# Patient Record
Sex: Male | Born: 1940 | Race: White | Hispanic: No | Marital: Married | Smoking: Former smoker
Health system: Southern US, Community
[De-identification: ages and names within clinical notes are randomized; demographics above are authoritative.]

## PROBLEM LIST (undated history)

## (undated) DIAGNOSIS — R011 Cardiac murmur, unspecified: Secondary | ICD-10-CM

## (undated) DIAGNOSIS — A770 Spotted fever due to Rickettsia rickettsii: Secondary | ICD-10-CM

## (undated) DIAGNOSIS — G4730 Sleep apnea, unspecified: Secondary | ICD-10-CM

## (undated) DIAGNOSIS — I4891 Unspecified atrial fibrillation: Secondary | ICD-10-CM

## (undated) DIAGNOSIS — I10 Essential (primary) hypertension: Secondary | ICD-10-CM

## (undated) DIAGNOSIS — I714 Abdominal aortic aneurysm, without rupture, unspecified: Secondary | ICD-10-CM

## (undated) DIAGNOSIS — I340 Nonrheumatic mitral (valve) insufficiency: Secondary | ICD-10-CM

## (undated) DIAGNOSIS — E785 Hyperlipidemia, unspecified: Secondary | ICD-10-CM

## (undated) DIAGNOSIS — Z8619 Personal history of other infectious and parasitic diseases: Secondary | ICD-10-CM

## (undated) DIAGNOSIS — R413 Other amnesia: Secondary | ICD-10-CM

## (undated) DIAGNOSIS — I2510 Atherosclerotic heart disease of native coronary artery without angina pectoris: Secondary | ICD-10-CM

## (undated) DIAGNOSIS — K5900 Constipation, unspecified: Secondary | ICD-10-CM

## (undated) HISTORY — PX: PENILE PROSTHESIS IMPLANT: SHX240

## (undated) HISTORY — DX: Spotted fever due to Rickettsia rickettsii: A77.0

## (undated) HISTORY — DX: Other amnesia: R41.3

## (undated) HISTORY — PX: OTHER SURGICAL HISTORY: SHX169

## (undated) HISTORY — DX: Personal history of other infectious and parasitic diseases: Z86.19

## (undated) HISTORY — DX: Constipation, unspecified: K59.00

---

## 2004-07-04 ENCOUNTER — Observation Stay (HOSPITAL_COMMUNITY): Admission: RE | Admit: 2004-07-04 | Discharge: 2004-07-05 | Payer: Self-pay | Admitting: Urology

## 2008-05-24 ENCOUNTER — Ambulatory Visit: Payer: Self-pay | Admitting: Internal Medicine

## 2008-05-24 DIAGNOSIS — E785 Hyperlipidemia, unspecified: Secondary | ICD-10-CM | POA: Insufficient documentation

## 2008-05-24 DIAGNOSIS — I10 Essential (primary) hypertension: Secondary | ICD-10-CM | POA: Insufficient documentation

## 2009-10-29 ENCOUNTER — Encounter: Payer: Self-pay | Admitting: Internal Medicine

## 2010-01-14 ENCOUNTER — Encounter: Payer: Self-pay | Admitting: Internal Medicine

## 2010-01-30 ENCOUNTER — Ambulatory Visit: Payer: Self-pay | Admitting: Internal Medicine

## 2010-03-30 ENCOUNTER — Encounter: Payer: Self-pay | Admitting: Internal Medicine

## 2010-03-31 ENCOUNTER — Encounter: Payer: Self-pay | Admitting: Internal Medicine

## 2010-04-23 ENCOUNTER — Ambulatory Visit: Payer: Self-pay | Admitting: Internal Medicine

## 2010-04-23 DIAGNOSIS — R930 Abnormal findings on diagnostic imaging of skull and head, not elsewhere classified: Secondary | ICD-10-CM

## 2010-07-23 NOTE — Miscellaneous (Signed)
Summary: Orders Update pft charges  Clinical Lists Changes  Orders: Added new Service order of Carbon Monoxide diffusing w/capacity (94720) - Signed Added new Service order of Lung Volumes (94240) - Signed Added new Service order of Spirometry (Pre & Post) (94060) - Signed 

## 2010-07-23 NOTE — Letter (Signed)
Summary: [facility]   Imported By: Sherian Rein 04/26/2010 14:59:23  _____________________________________________________________________  External Attachment:    Type:   Image     Comment:   External Document

## 2010-07-23 NOTE — Assessment & Plan Note (Signed)
Summary: Pulmonary/ copd re-eval   Visit Type:  Initial Consult Copy to:  Dr. Sherral Hammers Primary Kieli Golladay/Referring Lucienne Sawyers:  Dr. Laddie Aquas  CC:  COPD followup- recently had bronchitis x 2 wks ago.  He states that Dr Sherral Hammers at [facility] wanted him to followup here.  He states currently not having any problems with breathing. Marland Kitchen  History of Present Illness: 55 yowm quit smoking 6/09 at wt 197 with no resp problems except for tendency to bronchitis/wheezing worse in winter, better since quit smoking.  May 24, 2008 initial pulm eval stating he can still jog when he wants to at a slow pace. No noct symptoms or early am cough or congestion.    January 30, 2010 cc COPD followup-  had bronchitis x 2 wks ago.  He states that Dr Sherral Hammers at [facility] wanted him to followup here.  He states currently not having any problems with breathing at all at present.  no limiting activities, no need for atrovent neb or noct c/os'. Pt denies any significant sore throat, dysphagia, itching, sneezing,  nasal congestion or excess secretions,  fever, chills, sweats, unintended wt loss, pleuritic or exertional cp, hempoptysis, change in activity tolerance  orthopnea pnd or leg swelling. Pt also denies any obvious fluctuation in symptoms with weather or environmental change or other alleviating or aggravating factors.       Current Medications (verified): 1)  Simvastatin 40 Mg Tabs (Simvastatin) .Marland Kitchen.. 1 Every Evening 2)  Carisoprodol 350 Mg Tabs (Carisoprodol) .Marland Kitchen.. 1 Four Times A Day 3)  Hydrocodone-Acetaminophen 5-500 Mg Tabs (Hydrocodone-Acetaminophen) .Marland Kitchen.. 1 Every 4 Hours As Needed 4)  Pradaxa 150 Mg Caps (Dabigatran Etexilate Mesylate) .Marland Kitchen.. 1 Two Times A Day 5)  Lanoxin 0.125 Mg Tabs (Digoxin) .Marland Kitchen.. 1 Once Daily and 1/2 3 Times Per Wk 6)  Carvedilol 12.5 Mg Tabs (Carvedilol) .Marland Kitchen.. 1 Two Times A Day 7)  Mirapex 0.125 Mg Tabs (Pramipexole Dihydrochloride) .Marland Kitchen.. 1 At Bedtime 8)  Oxybutynin Chloride 10 Mg Xr24h-Tab (Oxybutynin Chloride)  .Marland Kitchen.. 1 At Bedtime 9)  Ipratropium Bromide 0.02 % Soln (Ipratropium Bromide) .Marland Kitchen.. 1 Eveyr 6 Hours As Needed  Allergies (verified): No Known Drug Allergies  Past History:  Past Medical History: Hypertension Hyperlipidemia Rheumatic fever as child CAF....................................................Marland KitchenRevencar Alvo/ HP Rorvack     - July 2011 dx'd with ok LV, leaking mitral valve COPD, no pft's on file       Vital Signs:  Patient profile:   70 year old male Height:      70 inches Weight:      191.13 pounds BMI:     27.52 O2 Sat:      99 % on Room air Temp:     98.3 degrees F oral Pulse rate:   90 / minute BP sitting:   116 / 74  (left arm)  Vitals Entered By: Vernie Murders (January 30, 2010 10:33 AM)  O2 Flow:  Room air  Physical Exam  Additional Exam:  wt 197  May 24, 2008  > 191 January 30, 2010  HEENT mild turbinate edema.  Oropharynx no thrush or excess pnd or cobblestoning.  No JVD or cervical adenopathy. Mild accessory muscle hypertrophy. Trachea midline, nl thryroid. Chest was hyperinflated by percussion with diminished breath sounds and moderate increased exp time without wheeze. Hoover sign positive at mid inspiration. Regular rate and rhythm without murmur gallop or rub or increase P2. No edema Abd: no hsm, nl excursion. Ext warm without cyanosis, clubbing.     Impression & Recommendations:  Problem # 1:  COPD UNSPECIFIED (ICD-496) Relatively mild clinically and not limiting him or exacerbating frequently enough to warrant any form of maint rx.   Each maintenance medication was reviewed in detail including most importantly the difference between maintenance and as needed and under what circumstances the prns are to be used.   Needs baseline PFT's .  Contingencies discussed re how to treat exacerbations  Problem # 2:  HYPERTENSION (ICD-401.9)  The following medications were removed from the medication list:    Diovan 80 Mg Tabs (Valsartan) .Marland Kitchen... 1  once daily His updated medication list for this problem includes:    Carvedilol 12.5 Mg Tabs (Carvedilol) .Marland Kitchen... 1 two times a day  Coreg use problematic in pts with unstable airways dz but probably ok here for now  Orders: Est. Patient Level IV (16109)  Medications Added to Medication List This Visit: 1)  Simvastatin 40 Mg Tabs (Simvastatin) .Marland Kitchen.. 1 every evening 2)  Pradaxa 150 Mg Caps (Dabigatran etexilate mesylate) .Marland Kitchen.. 1 two times a day 3)  Lanoxin 0.125 Mg Tabs (Digoxin) .Marland Kitchen.. 1 once daily and 1/2 3 times per wk 4)  Carvedilol 12.5 Mg Tabs (Carvedilol) .Marland Kitchen.. 1 two times a day 5)  Mirapex 0.125 Mg Tabs (Pramipexole dihydrochloride) .Marland Kitchen.. 1 at bedtime 6)  Oxybutynin Chloride 10 Mg Xr24h-tab (Oxybutynin chloride) .Marland Kitchen.. 1 at bedtime 7)  Ipratropium Bromide 0.02 % Soln (Ipratropium bromide) .Marland Kitchen.. 1 eveyr 6 hours as needed  Patient Instructions: 1)  Return to office in 3 months, sooner if needed with PFT's on return

## 2010-07-23 NOTE — Assessment & Plan Note (Signed)
Summary: Pulmonary/ f/u ov with G II COPD and no walking desat   Copy to:  Dr. Sherral Hammers Primary Provider/Referring Provider:  Dr. Laddie Aquas  CC:  Cough- resolved.  History of Present Illness: 24  yowm quit smoking 11/2007  at wt 197 with no resp problems except for tendency to bronchitis/wheezing worse in winter, better since quit smoking.  May 24, 2008 initial pulm eval stating he can still jog when he wants to at a slow pace. No noct symptoms or early am cough or congestion.    January 30, 2010 cc COPD followup-  had bronchitis x 2 wks ago.  He states that Dr Sherral Hammers at [facility] wanted him to followup here.  He states currently not having any problems with breathing at all at present.  no limiting activities, no need for atrovent neb or noct c/os'. rec no change in recs,  use neb if needed.  April 23, 2010 ov  cc sob in September dx'd with afib rx blood thinner and shocked > complete resolution of symptoms then Oct 8th woke up feeling like tired  then short of breath > Admitted to [facility] dx with ? acute bronchitis (but denies cough) with NT BNP 1500   at disharge now doe with ex not at rest,  "flu like symptoms" resolved but never had fever.  Main cc now is feeling faint if goes up steps too fast or gets in a hurry.  Also chronic paresthesias bilateral legs worse with walking sometimes, sometimes not . Pt denies any significant sore throat, dysphagia, itching, sneezing,  nasal congestion or excess secretions,  fever, chills, sweats, unintended wt loss, pleuritic or exertional cp, hempoptysis, change in activity tolerance  orthopnea pnd or leg swelling Pt also denies any obvious fluctuation in symptoms with weather or environmental change or other alleviating or aggravating factors.        Current Medications (verified): 1)  Pradaxa 150 Mg Caps (Dabigatran Etexilate Mesylate) .Marland Kitchen.. 1 Two Times A Day 2)  Carvedilol 12.5 Mg Tabs (Carvedilol) .Marland Kitchen.. 1 Two Times A Day 3)  Mirapex 0.125 Mg Tabs  (Pramipexole Dihydrochloride) .Marland Kitchen.. 1 At Bedtime 4)  Oxybutynin Chloride 10 Mg Xr24h-Tab (Oxybutynin Chloride) .Marland Kitchen.. 1 At Bedtime 5)  Amiodarone Hcl 200 Mg Tabs (Amiodarone Hcl) .Marland Kitchen.. 1 Once Daily 6)  Centrum Silver  Tabs (Multiple Vitamins-Minerals) .Marland Kitchen.. 1 Once Daily 7)  Calcium Plus Vitamin D 500-50 Mg-Unit Caps (Calcium Carbonate-Vitamin D) .Marland Kitchen.. 1 Two Times A Day 8)  Magnesium 250 Mg Tabs (Magnesium) .Marland Kitchen.. 1 Two Times A Day 9)  Ipratropium Bromide 0.02 % Soln (Ipratropium Bromide) .Marland Kitchen.. 1 Eveyr 6 Hours As Needed 10)  Carisoprodol 350 Mg Tabs (Carisoprodol) .Marland Kitchen.. 1 Four Times A Day As Needed 11)  Hydrocodone-Acetaminophen 5-500 Mg Tabs (Hydrocodone-Acetaminophen) .Marland Kitchen.. 1 Every 6 Hrs As Needed  Allergies (verified): No Known Drug Allergies  Past History:  Past Medical History: Hypertension Hyperlipidemia Rheumatic fever as child Spinal stenosis  CAF....................................................Marland KitchenRevencar Edgerton/ HP Rorvack     - July 2011 dx'd with ok LV, leaking mitral valve COPD      - PFT's April 23, 2010 FEV 1 2.05 (69%) ratio 61  and no better p B2,  DLCO 89% ? Pulmonary Fibrososi       - CT chest 10/29/09 and 03/30/10 minimal PF, no acute changes       Vital Signs:  Patient profile:   70 year old male Weight:      188 pounds O2 Sat:  95 % on Room air Temp:     97.4 degrees F oral Pulse rate:   62 / minute BP sitting:   124 / 68  (left arm)  Vitals Entered By: Vernie Murders (April 23, 2010 9:44 AM)  O2 Flow:  Room air  Serial Vital Signs/Assessments:  Comments: 10:43 AM Ambulatory Pulse Oximetry  Resting; HR__65___    02 Sat___97%ra__  Lap1 (185 feet)   HR_78____   02 Sat__96%ra___ Lap2 (185 feet)   HR_78___   02 Sat__96%ra___    Lap3 (185 feet)   HR__85___   02 Sat__95%ra___  _x__Test Completed without Difficulty ___Test Stopped due to:   By: Vernie Murders    Physical Exam  Additional Exam:  wt 197  May 24, 2008  > 191 January 30, 2010  > 188 April 24, 2010  HEENT mild turbinate edema.  Oropharynx no thrush or excess pnd or cobblestoning.  No JVD or cervical adenopathy. Mild accessory muscle hypertrophy. Trachea midline, nl thryroid. Chest was hyperinflated by percussion with diminished breath sounds and moderate increased exp time without wheeze. Hoover sign positive at mid inspiration. Regular rate and rhythm with II/VI hsm but no  gallop or rub or increase P2. No edema Abd: no hsm, nl excursion. Ext warm without cyanosis, clubbing.     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  Once the high risk activity stops,  the symptoms should not suddenly erupt.  If so, the differential diagnosis should include  obesity/deconditioning,  LPR/Reflux, CHF, or side effect of medications (amiodarone with PF and coreg with asthma)  Presently not limited by breathing.  I reviewed the Flethcher curve with her that basically says if you quit smoking when your best day FEV1 is still well preserved it is highly unlikely you will progress to severe disease and informed the patient there was no medication on the market that has proven to change the curve or the likelihood of progression.  Treatment is symptomatic and presenlty not needed.  He may be prone to exacerbations but without cough I very seriously doubt his acute problem on Oct 8 resulting in admit was copd related.  Problem # 2:  ABNORMAL LUNG XRAY (ICD-793.1) Ct from 10/29/09 and 03/30/10 reviewed - he does have very  mild PF but this is not evolving, is not assoc with ex desat or low DLCO - it is a concern in  a pt on amiodarone and if any progression at all would first stop the amiodarone in favor of multaq,  then return here if progresses.  would also follow serial ESR's here.  Medications Added to Medication List This Visit: 1)  Amiodarone Hcl 200 Mg Tabs (Amiodarone hcl) .Marland Kitchen.. 1 once daily 2)  Centrum Silver Tabs (Multiple vitamins-minerals) .Marland Kitchen.. 1 once daily 3)  Calcium Plus Vitamin D 500-50 Mg-unit Caps (Calcium carbonate-vitamin d) .Marland Kitchen.. 1 two times a day 4)  Magnesium 250 Mg Tabs (Magnesium) .Marland Kitchen.. 1 two times a day 5)  Carisoprodol 350 Mg Tabs (Carisoprodol) .Marland Kitchen.. 1 four times a day as needed 6)  Hydrocodone-acetaminophen 5-500 Mg Tabs (Hydrocodone-acetaminophen) .Marland Kitchen.. 1 every 6 hrs as needed  Other Orders: Est. Patient Level IV (99214) Pulse Oximetry, Ambulatory (16109) Misc. Referral (Misc. Ref)  Patient Instructions: 1)  Your breathing is actually quite good by your lung function tests 2)  it's ok to use the nebulizer as needed for now up to every 6 hours 3)  Your coreg and amiodarone could be causing breathing problems and may be need to be changed to alternatives (bisoprolol and multaq)  4)  GERD (REFLUX)  is a common cause of respiratory symptoms. It commonly presents without heartburn and can be treated with medication, but also with lifestyle changes including avoidance of late meals, excessive alcohol, smoking cessation, and avoid fatty foods, chocolate, peppermint, colas, red wine, and acidic juices such as orange juice. NO MINT OR MENTHOL PRODUCTS SO NO COUGH DROPS  5)  USE SUGARLESS CANDY INSTEAD (jolley ranchers)  6)  NO OIL BASED VITAMINS

## 2010-08-14 ENCOUNTER — Other Ambulatory Visit (INDEPENDENT_AMBULATORY_CARE_PROVIDER_SITE_OTHER): Payer: Self-pay

## 2010-08-14 DIAGNOSIS — R0989 Other specified symptoms and signs involving the circulatory and respiratory systems: Secondary | ICD-10-CM

## 2010-11-08 NOTE — Op Note (Signed)
NAME:  Timothy Wheeler, Timothy Wheeler              ACCOUNT NO.:  [id number]   MEDICAL RECORD NO.:  [id number]          PATIENT TYPE:  OBV   LOCATION:  0370                         FACILITY:  [facility]   PHYSICIAN:  Sigmund I. Patsi Sears, M.D.DATE OF BIRTH:  [birth year]   DATE OF PROCEDURE:  07/04/2004  DATE OF DISCHARGE:  07/05/2004                                 OPERATIVE REPORT   PREOPERATIVE DIAGNOSIS:  Organic impotence.   POSTOPERATIVE DIAGNOSIS:  Organic impotence.   PROCEDURE PERFORMED:  Insertion of a three-piece inflatable penile  prosthesis (AMS).   ATTENDING SURGEON:  Sigmund I. Patsi Sears, M.D.   RESIDENT SURGEON:  Thyra Breed, M.D.   ANESTHESIA:  General endotracheal.   ESTIMATED BLOOD LOSS:  50 mL.   DRAINS:  A 16 French Foley catheter to straight drainage.   COMPLICATIONS:  None.   INDICATION FOR PROCEDURE:  Timothy Wheeler is a pleasant 70 year old male with a  history of erectile dysfunction.  Specifically, he has had difficulty  maintaining erections since 1997.  The patient has been treated with  multiple research medications for a history of herpetic neuralgia.  He has  also had NMP monitoring in the past, which showed a partial erection and  sensory loss.  He has had a good result for some years with Caverject  injections.  However, this is no longer helping the patient to have an  erection sufficient for intercourse.  He has also failed oral medication  therapy.  Therefore, at this time he wishes to undergo surgical intervention  in the form of implantation of a three-piece penile prosthesis.  All risks,  benefits, and alternatives of the procedure have been described in detail,  and the patient is willing to proceed.   PROCEDURE IN DETAIL:  Following identification by his arm bracelet, the  patient was brought to the operating room and placed in the supine position.  Here he underwent successful induction of general endotracheal anesthesia  and received preoperative IV  antibiotics.  He then underwent a 10-minute  iodine scrub.  His genitalia were then prepped with Betadine and draped in  the usual sterile fashion.  We initially created an approximate 4 cm  incision along the lines of Langer just at the dorsal base of the penis in a  U-shaped fashion.  The incision was carried down through the subcutaneous  tissue using Bovie electrocautery.  We initially dissected the subcutaneous  tissue through Scarpa's fascia superiorly above the pubic symphysis until  the rectus fascia was encountered.  A small 2 cm incision was made in the  rectus fascia in the midline.  Surgeon's finger was used bluntly to create a  space below the right rectus muscle for later placement of the reservoir.  We then placed a Weitlaner retractor and continued our dissection inferiorly  until the corpora were encountered.  Using Metzenbaum scissors, we then  cleared the overlying tissue from the corpora.  PDS 2-0 was then used to  provide two traction sutures on each corpora.  Corporotomies were then made  using a 15 blade scalpel.  Metzenbaum scissors were used to further create  the space both proximally and distally for dilation of the corpora.  The  corpora were then successively dilated using the [facility] dilators, starting  with a 9 up to a 14 [place].  A 13 French Hegar dilator was then placed  proximally and distally in both corpora and found to pass without  difficulty.  In fact, the dilation was not very difficult.  We then used the  marking tool to delineate approximately 19 cm bilaterally.  Therefore, we  elected to place an 18 cm device with 1 cm rear tips bilaterally.  The wound  was copiously irrigated with antibiotic solution and sterile green towels  were placed on the operative field.  The device was then brought onto the  field.  Using the [place] needle and [facility] tool, we placed the traction  sutures on the distal cylinders lateral through the glans bilaterally, well   away from the urethral meatus.  With 1 cm rear tips, these were then placed  in the proximal corporotomies.  The fit appeared to be excellent.  The  device was cycled with some gentle traction on the corporotomy sites to  reveal a straight penis with the distal portion of the cylinders palpable  just beneath the glans in their normal locations.  PDS 2-0 was then used in  a running fashion to close the corporotomies.  The traction sutures were  also tied over the corporotomy incisions for extra support.  Any excess  covering on the cylinder tubing was now removed.  A space was then created  bluntly into the most dependent portion of the right hemiscrotum.  Specifically, the right hemiscrotum was everted through the incision and a  dry Ray-Tec used to separate the surrounding tissue to allow placement of  the pump.  The pump was then seated into this pocket, which was well in the  dependent portion of the right hemiscrotum away from the testicle.  A  Babcock clamp was then placed across the tubing through the scrotal skin to  maintain the pump's position.  We then placed our reservoir in the  previously-mentioned space just beneath the right rectus muscle.  Approximately 70 mL were used to fill the reservoir, and there appeared to  be no back pressure.  Therefore, we removed 10 mL and left 60 mL in the  reservoir.  We then made our connections from the pump to the reservoir  using a straight connector.  Again the device was cycled.  It was molded  slightly to reveal an excellent result adequate for penetration.  The device  was then deflated.  A small amount of fluid was left in the cylinders to  tamponade any bleeding in the corpora.  We then closed the rectus fascia  around the reservoir tubing using a running 2-0 PDS.  The dartos was then  closed using a running 2-0 Vicryl suture, keeping all tubing and components well below the skin.  The skin was then closed in a subcuticular fashion   using a 5-0 Monocryl.  Dermabond was used to complete the closure.  Of note,  the patient had a 38 French Foley catheter inserted before the case, and  this was left to straight drainage.  A sterile dressing was then applied to  the incision.  The patient tolerated the procedure well, and there were no  complications.  Please note that Dr. Jethro Bolus was present and  participated in the entire procedure as he was the responsible surgeon.   DISPOSITION:  After awaking from general anesthesia, the patient was  transferred to the postanesthesia care unit in stable condition.  From here  he would be transferred to the floor for overnight observation and removal  of his catheter the following morning.      EG/MEDQ  D:  07/07/2004  T:  07/07/2004  Job:  [id number]

## 2010-11-27 ENCOUNTER — Encounter (INDEPENDENT_AMBULATORY_CARE_PROVIDER_SITE_OTHER): Payer: Self-pay | Admitting: *Deleted

## 2010-11-27 ENCOUNTER — Encounter: Payer: Self-pay | Admitting: Cardiology

## 2014-11-23 ENCOUNTER — Other Ambulatory Visit: Payer: Self-pay | Admitting: Urology

## 2014-12-14 NOTE — Patient Instructions (Addendum)
Kaleo NIKOLOZ KUROWSKI  12/14/2014   Your procedure is scheduled on:     12/21/2014    Report to [facility] Main  Entrance take [place]  elevators to 3rd floor to  Short Stay Center at      0530 AM.  Call this number if you have problems the morning of surgery [phone]   Remember: ONLY 1 PERSON MAY GO WITH YOU TO SHORT STAY TO GET  READY MORNING OF YOUR SURGERY.  Do not eat food or drink liquids :After Midnight.     Take these medicines the morning of surgery with A SIP OF WATER:   Flecainide ( Tambocor), Topamax                                 You may not have any metal on your body including hair pins and              piercings  Do not wear jewelry, lotions, powders or perfumes, deodorant                        Men may shave face and neck.   Do not bring valuables to the hospital. [facility] IS NOT             RESPONSIBLE   FOR VALUABLES.  Contacts, dentures or bridgework may not be worn into surgery.       Patients discharged the day of surgery will not be allowed to drive home.  Name and phone number of your driver:  Special Instructions: coughing and deep breathing exercises, leg exercises               Please read over the following fact sheets you were given: _____________________________________________________________________             [facility] - Preparing for Surgery Before surgery, you can play an important role.  Because skin is not sterile, your skin needs to be as free of germs as possible.  You can reduce the number of germs on your skin by washing with CHG (chlorahexidine gluconate) soap before surgery.  CHG is an antiseptic cleaner which kills germs and bonds with the skin to continue killing germs even after washing. Please DO NOT use if you have an allergy to CHG or antibacterial soaps.  If your skin becomes reddened/irritated stop using the CHG and inform your nurse when you arrive at Short Stay. Do not shave (including legs and  underarms) for at least 48 hours prior to the first CHG shower.  You may shave your face/neck. Please follow these instructions carefully:  1.  Shower with CHG Soap the night before surgery and the  morning of Surgery.  2.  If you choose to wash your hair, wash your hair first as usual with your  normal  shampoo.  3.  After you shampoo, rinse your hair and body thoroughly to remove the  shampoo.                           4.  Use CHG as you would any other liquid soap.  You can apply chg directly  to the skin and wash  Gently with a scrungie or clean washcloth.  5.  Apply the CHG Soap to your body ONLY FROM THE NECK DOWN.   Do not use on face/ open                           Wound or open sores. Avoid contact with eyes, ears mouth and genitals (private parts).                       Wash face,  Genitals (private parts) with your normal soap.             6.  Wash thoroughly, paying special attention to the area where your surgery  will be performed.  7.  Thoroughly rinse your body with warm water from the neck down.  8.  DO NOT shower/wash with your normal soap after using and rinsing off  the CHG Soap.                9.  Pat yourself dry with a clean towel.            10.  Wear clean pajamas.            11.  Place clean sheets on your bed the night of your first shower and do not  sleep with pets. Day of Surgery : Do not apply any lotions/deodorants the morning of surgery.  Please wear clean clothes to the hospital/surgery center.  FAILURE TO FOLLOW THESE INSTRUCTIONS MAY RESULT IN THE CANCELLATION OF YOUR SURGERY PATIENT SIGNATURE_________________________________  NURSE SIGNATURE__________________________________  ________________________________________________________________________

## 2014-12-18 ENCOUNTER — Encounter (HOSPITAL_COMMUNITY)
Admission: RE | Admit: 2014-12-18 | Discharge: 2014-12-18 | Disposition: A | Discharge status: Home / Self Care | Payer: Medicare Other | Source: Ambulatory Visit | Attending: Urology | Admitting: Urology

## 2014-12-18 ENCOUNTER — Encounter (HOSPITAL_COMMUNITY): Payer: Self-pay

## 2014-12-18 DIAGNOSIS — R350 Frequency of micturition: Secondary | ICD-10-CM | POA: Diagnosis not present

## 2014-12-18 DIAGNOSIS — Z7902 Long term (current) use of antithrombotics/antiplatelets: Secondary | ICD-10-CM | POA: Diagnosis not present

## 2014-12-18 DIAGNOSIS — Z79899 Other long term (current) drug therapy: Secondary | ICD-10-CM | POA: Diagnosis not present

## 2014-12-18 DIAGNOSIS — E780 Pure hypercholesterolemia: Secondary | ICD-10-CM | POA: Diagnosis not present

## 2014-12-18 DIAGNOSIS — F17200 Nicotine dependence, unspecified, uncomplicated: Secondary | ICD-10-CM | POA: Diagnosis not present

## 2014-12-18 DIAGNOSIS — R3912 Poor urinary stream: Secondary | ICD-10-CM | POA: Diagnosis not present

## 2014-12-18 DIAGNOSIS — N521 Erectile dysfunction due to diseases classified elsewhere: Secondary | ICD-10-CM | POA: Diagnosis not present

## 2014-12-18 DIAGNOSIS — I739 Peripheral vascular disease, unspecified: Secondary | ICD-10-CM | POA: Diagnosis not present

## 2014-12-18 DIAGNOSIS — N401 Enlarged prostate with lower urinary tract symptoms: Secondary | ICD-10-CM | POA: Diagnosis present

## 2014-12-18 DIAGNOSIS — I2510 Atherosclerotic heart disease of native coronary artery without angina pectoris: Secondary | ICD-10-CM | POA: Diagnosis not present

## 2014-12-18 DIAGNOSIS — I10 Essential (primary) hypertension: Secondary | ICD-10-CM | POA: Diagnosis not present

## 2014-12-18 DIAGNOSIS — R351 Nocturia: Secondary | ICD-10-CM | POA: Diagnosis not present

## 2014-12-18 DIAGNOSIS — G4730 Sleep apnea, unspecified: Secondary | ICD-10-CM | POA: Diagnosis not present

## 2014-12-18 DIAGNOSIS — N138 Other obstructive and reflux uropathy: Secondary | ICD-10-CM | POA: Diagnosis not present

## 2014-12-18 DIAGNOSIS — R3914 Feeling of incomplete bladder emptying: Secondary | ICD-10-CM | POA: Diagnosis not present

## 2014-12-18 DIAGNOSIS — I771 Stricture of artery: Secondary | ICD-10-CM | POA: Diagnosis not present

## 2014-12-18 DIAGNOSIS — I4891 Unspecified atrial fibrillation: Secondary | ICD-10-CM | POA: Diagnosis not present

## 2014-12-18 DIAGNOSIS — R972 Elevated prostate specific antigen [PSA]: Secondary | ICD-10-CM | POA: Diagnosis not present

## 2014-12-18 HISTORY — DX: Nonrheumatic mitral (valve) insufficiency: I34.0

## 2014-12-18 HISTORY — DX: Atherosclerotic heart disease of native coronary artery without angina pectoris: I25.10

## 2014-12-18 HISTORY — DX: Sleep apnea, unspecified: G47.30

## 2014-12-18 HISTORY — DX: Abdominal aortic aneurysm, without rupture: I71.4

## 2014-12-18 HISTORY — DX: Unspecified atrial fibrillation: I48.91

## 2014-12-18 HISTORY — DX: Abdominal aortic aneurysm, without rupture, unspecified: I71.40

## 2014-12-18 HISTORY — DX: Hyperlipidemia, unspecified: E78.5

## 2014-12-18 HISTORY — DX: Cardiac murmur, unspecified: R01.1

## 2014-12-18 HISTORY — DX: Essential (primary) hypertension: I10

## 2014-12-18 LAB — CBC
HEMATOCRIT: 42.1 % (ref 39.0–52.0)
Hemoglobin: 13.6 g/dL (ref 13.0–17.0)
MCH: 30.8 pg (ref 26.0–34.0)
MCHC: 32.3 g/dL (ref 30.0–36.0)
MCV: 95.2 fL (ref 78.0–100.0)
Platelets: 161 10*3/uL (ref 150–400)
RBC: 4.42 MIL/uL (ref 4.22–5.81)
RDW: 13 % (ref 11.5–15.5)
WBC: 7.3 10*3/uL (ref 4.0–10.5)

## 2014-12-18 LAB — BASIC METABOLIC PANEL
ANION GAP: 8 (ref 5–15)
BUN: 23 mg/dL — ABNORMAL HIGH (ref 6–20)
CO2: 25 mmol/L (ref 22–32)
Calcium: 9.1 mg/dL (ref 8.9–10.3)
Chloride: 109 mmol/L (ref 101–111)
Creatinine, Ser: 1.27 mg/dL — ABNORMAL HIGH (ref 0.61–1.24)
GFR calc Af Amer: >60 mL/min (ref >60)
GFR, EST NON AFRICAN AMERICAN: 54 mL/min — AB (ref >60)
GLUCOSE: 94 mg/dL (ref 65–99)
POTASSIUM: 3.8 mmol/L (ref 3.5–5.1)
Sodium: 142 mmol/L (ref 135–145)

## 2014-12-18 NOTE — Progress Notes (Signed)
05/15/2014 LOV with Tollie Pizza - cardiology on chart  EKG- 2014 on chart  ECHO- 05/2014 on chart  05/11/2014 - Office visit with Lu[facility] on chart on chart

## 2014-12-18 NOTE — Progress Notes (Signed)
BMp results done 12/18/2014 faxed via [facility] to Dr Patsi Sears.

## 2014-12-19 NOTE — Progress Notes (Signed)
Final EKG 12/18/2014 in EPIc.

## 2014-12-20 NOTE — Anesthesia Preprocedure Evaluation (Addendum)
Anesthesia Evaluation  Patient identified by MRN, date of birth, ID band Patient awake    Reviewed: Allergy & Precautions, NPO status , Patient's Chart, lab work & pertinent test results  History of Anesthesia Complications Negative for: history of anesthetic complications  Airway Mallampati: II  TM Distance: >3 FB Neck ROM: Full    Dental no notable dental hx. (+) Dental Advisory Given, Poor Dentition   Pulmonary sleep apnea and Continuous Positive Airway Pressure Ventilation , former smoker,  breath sounds clear to auscultation  Pulmonary exam normal       Cardiovascular hypertension, Pt. on medications + CAD and + Peripheral Vascular Disease Normal cardiovascular exam+ dysrhythmias Atrial Fibrillation + Valvular Problems/Murmurs MR Rhythm:Regular Rate:Normal  Last cardiology note reviewed in chart. Echo 05/2014: EF 55-60%, mild-mod MR, mild AR, normal LV fxn   Neuro/Psych negative neurological ROS  negative psych ROS   GI/Hepatic negative GI ROS, Neg liver ROS,   Endo/Other  negative endocrine ROS  Renal/GU negative Renal ROS  negative genitourinary   Musculoskeletal negative musculoskeletal ROS (+)   Abdominal   Peds negative pediatric ROS (+)  Hematology negative hematology ROS (+)   Anesthesia Other Findings   Reproductive/Obstetrics negative OB ROS                            Anesthesia Physical Anesthesia Plan  ASA: III  Anesthesia Plan: General   Post-op Pain Management:    Induction: Intravenous  Airway Management Planned: LMA  Additional Equipment:   Intra-op Plan:   Post-operative Plan: Extubation in OR  Informed Consent: I have reviewed the patients History and Physical, chart, labs and discussed the procedure including the risks, benefits and alternatives for the proposed anesthesia with the patient or authorized representative who has indicated his/her  understanding and acceptance.   Dental advisory given  Plan Discussed with: CRNA  Anesthesia Plan Comments:         Anesthesia Quick Evaluation

## 2014-12-20 NOTE — H&P (Signed)
Reason For Visit Cystoscopy, flowrate, PVR & PUS   Active Problems Problems  1. Benign prostatic hyperplasia with urinary obstruction (N40.1,N13.8)   Assessed By: Jethro Bolus (Urology); Last Assessed: 07 Nov 2014 2. Elevated prostate specific antigen (PSA) (R97.2) 3. 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 Nocturia (R35.1)  History of Present Illness    74 year old male returns today for a cystoscopy, flowrate, PVR & PUS for possible Urolift candidate. Hx of BPH, elevated PSA, nocturia x 3 & ED. He was recommended to have a TURP in Oct. 2013, but he postponed because of wife's job problems (laid off).      He was previously in the DDAVP study in 2012 He is status post microwave thermotherapy in 2010 for BPH. He complains of urinary frequency, weak and intermittent flow, urinary straining, and nocturia x 3. His international prostate symptom score is 15 (normal 7). Last PSA was 1.10 in Oct. 2010. The patient was placed on Avodart, but stopped the medication because of breast development, and peripheral neuropathy, controlled with Neurontin. He has a small aortic aneurysm, and a history of atrial fibrillation, and a penile prosthetic inflatable device.    Prostate ultrasound was accomplished on 03/23/12, showing a length of 4.30 cm, height of 2.0 cm, with a 4.44 cm, a volume of 29.01 cm.    Video urodynamics is accomplished on 03/23/12, in the sitting position. The first sensation of filling at 209 cc, normal desire was at 225 cc and strong desire was at 280 cc. The patient complains of pain in the bladder at maximum capacity. There is no instability noted, however. The patient did not leak for leak point pressure determination.    Pressure flow study: The patient is able to generate a voluntary bladder contraction, with a voided volume of 180 cc maximum flow rate is 4 cc per second, with detrusor pressure at maximum flow of 23 cm water pressure.  Maximum detrusor pressure is 38 cm water. PVR is 200 cc.    VCUG is accomplished, and shows a closed bladder neck. There is no bony abnormality.    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 There is loss of compliance during filling, with an end filling pressure of 12 cm water. There is no instability noted. He is able to generate a voluntary contraction. He voids with an interrupted obstructed flow pattern. He has a 200 cc PVR. He uses position changes, abdominal straining to void. He often has difficulty getting his flow started, and has an intermittent flow pattern.    He may be a candidate for traditional TURP.   Past Medical History Problems  1. History of Atrial fibrillation (I48.91) 2. History of hypercholesterolemia (Z86.39) 3. History of shortness of breath (Z87.898) 4. History of Murmur (R01.1)  Surgical History Problems  1. History of Penile Prosthetic Device Inflatable  Current Meds 1. Amitriptyline HCl - 25 MG Oral Tablet;  Therapy: (Recorded:18Mar2016) to Recorded 2. Caltrate 600+D TABS;  Therapy: (Recorded:23Jan2015) to Recorded 3. Centrum Silver TABS;  Therapy: (Recorded:23Jan2015) to Recorded 4. CoQ10 CAPS;  Therapy: (Recorded:23Jan2015) to Recorded 5. Flecainide Acetate 100 MG Oral Tablet;  Therapy: (Recorded:18Mar2016) to Recorded 6. Folic Acid 800 MCG Oral Tablet;  Therapy: (Recorded:23Jan2015) to Recorded 7. Magnesium 250 MG Oral Tablet;  Therapy: (Recorded:23Jan2015) to Recorded 8. Pradaxa 150 MG Oral Capsule;  Therapy: (Recorded:02Aug2011) to Recorded 9. ROPINIRole HCl - 0.5 MG Oral Tablet;  Therapy: (Recorded:18Mar2016) to Recorded 10. Simvastatin 40 MG Oral Tablet;  Therapy: 03Jul2012 to Recorded 11. TiZANidine HCl - 4 MG Oral Tablet;   Therapy: (Recorded:17May2016) to Recorded 12. Topiramate 25 MG Oral Tablet;   Therapy: (Recorded:18Mar2016) to Recorded 13. Valsartan-Hydrochlorothiazide 160-25 MG Oral Tablet;    Therapy: (Recorded:23Jan2015) to Recorded 14. Vitamin B12 TABS;   Therapy: (Recorded:23Jan2015) to Recorded  Allergies Medication  1. Lyrica CAPS  Social History Problems  1. Smoker, Current Status Unknown  Review of Systems Genitourinary, constitutional, skin, eye, otolaryngeal, hematologic/lymphatic, cardiovascular, pulmonary, endocrine, musculoskeletal, gastrointestinal, neurological and psychiatric system(s) were reviewed and pertinent findings if present are noted and are otherwise negative.  Genitourinary: urinary frequency, nocturia, weak urinary stream, urinary stream starts and stops, incomplete emptying of bladder and erectile dysfunction, but no feelings of urinary urgency and initiating urination does not require straining.  Gastrointestinal: constipation, but no nausea, no vomiting, no heartburn and no diarrhea.  Constitutional: no fever, no night sweats, not feeling tired (fatigue) and no recent weight loss.  Integumentary: no new skin rashes or lesions and no pruritus.  Eyes: no blurred vision and no diplopia.  ENT: no sore throat and no sinus problems.  Hematologic/Lymphatic: no tendency to easily bruise and no swollen glands.  Cardiovascular: no chest pain and no leg swelling.  Respiratory: no shortness of breath and no cough.  Endocrine: no polydipsia.  Musculoskeletal: back pain, but no joint pain.  Neurological: dizziness, but no headache.  Psychiatric: no anxiety and no depression.    Vitals Vital Signs [Data Includes: Last 1 Day]  Recorded: 17May2016 01:50PM  Blood Pressure: 122 / 72 Temperature: 98.3 F Heart Rate: 88  Physical Exam Constitutional: Well nourished and well developed . No acute distress.  ENT:. The ears and nose are normal in appearance.  Neck: The appearance of the neck is normal and no neck mass is present.  Pulmonary: No respiratory distress and normal respiratory rhythm and effort.  Cardiovascular: Heart rate and rhythm are normal . No  peripheral edema.  Abdomen: The abdomen is soft and nontender. No masses are palpated. No CVA tenderness. No hernias are palpable. No hepatosplenomegaly noted.  Rectal: Rectal exam demonstrates normal sphincter tone and the anus is normal on inspection. Estimated prostate size is 3+. Normal rectal tone, no rectal masses, prostate is smooth, symmetric and non-tender.  Genitourinary: Examination of the penis demonstrates no discharge, no masses, no lesions and a normal meatus. The scrotum is without lesions. The right epididymis is palpably normal and non-tender. The left epididymis is palpably normal and non-tender. The right testis is non-tender and without masses. The left testis is non-tender and without masses.  Lymphatics: The femoral and inguinal nodes are not enlarged or tender.  Skin: Normal skin turgor, no visible rash and no visible skin lesions.  Neuro/Psych:. Mood and affect are appropriate.    Results/Data  Flow Rate: Voided 175 ml. A peak flow rate of 202ml/s and mean flow rate of 552ml/s.  PVR: Ultrasound PVR 40.73 ml.    Procedure Prostate u/s today: Length - 4.27cm, Height - 2.91cm and Width - 4.69cm. Total volume - 30.6 grams.   Procedure: Cystoscopy  Chaperone Present: Hassel Nethkim Lewis.  Indication: Lower Urinary Tract Symptoms.  Informed Consent: Risks, benefits, and potential adverse events were discussed and informed consent was obtained from the patient.  Prep: The patient was prepped with betadine.  Anesthesia:. Local anesthesia was administered intraurethrally with 2% lidocaine jelly.  Antibiotic prophylaxis: Ciprofloxacin.  Procedure Note:  Urethral meatus:. No abnormalities.  Anterior urethra: No abnormalities.  Prostatic urethra:. The lateral  prostatic lobes were enlarged. No intravesical median lobe was visualized.  Bladder: The ureteral orifices were in the normal anatomic position bilaterally. A systematic survey of the bladder demonstrated no bladder tumors or stones.  Examination of the bladder demonstrated no trabeculation and no diverticulum. The patient tolerated the procedure well.  Complications: None.    Assessment Assessed  1. Benign prostatic hyperplasia with urinary obstruction (N40.1,N13.8) 2. Nocturia (R35.1)  Increasing Urinary Retention, with IPSS=12, and 30 cc prostate, He has short prostatic fossa, and no median lobe. Flow rate is 2cc/sec peak. He is on Pradaxia, and he will stop 3 days prior to his Urolift procedure.    Urolift offers him an outpatient procedure that will not have retrograde ejaculation, will not interfere with any future treatments of the prostate, and should have 50 % improvement with durable results over 4 yr f/u period. he may have mild hematuria, and may have foley for 24 hrs.   Plan Urolift transurethral prostate procedure  Stop Pradexa 3 days prior to procedure.   ck with insurance re: coverage  ck with rep: re: IPP implanted.   Discussion/Summary cc; Dr. Ardelle Park, [place], [place].     Signatures Electronically signed by : Jethro Bolus, M.D.; Nov 07 2014  2:50PM EST

## 2014-12-21 ENCOUNTER — Encounter (HOSPITAL_COMMUNITY)
Admission: RE | Disposition: A | Discharge status: Home / Self Care | Payer: Self-pay | Source: Ambulatory Visit | Attending: Urology

## 2014-12-21 ENCOUNTER — Ambulatory Visit (HOSPITAL_COMMUNITY): Payer: Medicare Other | Admitting: Anesthesiology

## 2014-12-21 ENCOUNTER — Ambulatory Visit (HOSPITAL_COMMUNITY)
Admission: RE | Admit: 2014-12-21 | Discharge: 2014-12-21 | Disposition: A | Discharge status: Home / Self Care | Payer: Medicare Other | Source: Ambulatory Visit | Attending: Urology | Admitting: Urology

## 2014-12-21 ENCOUNTER — Encounter (HOSPITAL_COMMUNITY): Payer: Self-pay | Admitting: *Deleted

## 2014-12-21 DIAGNOSIS — R3914 Feeling of incomplete bladder emptying: Secondary | ICD-10-CM | POA: Diagnosis not present

## 2014-12-21 DIAGNOSIS — N401 Enlarged prostate with lower urinary tract symptoms: Secondary | ICD-10-CM | POA: Diagnosis not present

## 2014-12-21 DIAGNOSIS — E780 Pure hypercholesterolemia: Secondary | ICD-10-CM | POA: Insufficient documentation

## 2014-12-21 DIAGNOSIS — G4730 Sleep apnea, unspecified: Secondary | ICD-10-CM | POA: Insufficient documentation

## 2014-12-21 DIAGNOSIS — N138 Other obstructive and reflux uropathy: Secondary | ICD-10-CM | POA: Diagnosis not present

## 2014-12-21 DIAGNOSIS — R3912 Poor urinary stream: Secondary | ICD-10-CM | POA: Insufficient documentation

## 2014-12-21 DIAGNOSIS — F17200 Nicotine dependence, unspecified, uncomplicated: Secondary | ICD-10-CM | POA: Insufficient documentation

## 2014-12-21 DIAGNOSIS — I771 Stricture of artery: Secondary | ICD-10-CM | POA: Insufficient documentation

## 2014-12-21 DIAGNOSIS — I739 Peripheral vascular disease, unspecified: Secondary | ICD-10-CM | POA: Insufficient documentation

## 2014-12-21 DIAGNOSIS — Z79899 Other long term (current) drug therapy: Secondary | ICD-10-CM | POA: Insufficient documentation

## 2014-12-21 DIAGNOSIS — R350 Frequency of micturition: Secondary | ICD-10-CM | POA: Diagnosis not present

## 2014-12-21 DIAGNOSIS — I10 Essential (primary) hypertension: Secondary | ICD-10-CM | POA: Insufficient documentation

## 2014-12-21 DIAGNOSIS — R351 Nocturia: Secondary | ICD-10-CM | POA: Insufficient documentation

## 2014-12-21 DIAGNOSIS — I4891 Unspecified atrial fibrillation: Secondary | ICD-10-CM | POA: Insufficient documentation

## 2014-12-21 DIAGNOSIS — I2510 Atherosclerotic heart disease of native coronary artery without angina pectoris: Secondary | ICD-10-CM | POA: Insufficient documentation

## 2014-12-21 DIAGNOSIS — Z7902 Long term (current) use of antithrombotics/antiplatelets: Secondary | ICD-10-CM | POA: Insufficient documentation

## 2014-12-21 DIAGNOSIS — R972 Elevated prostate specific antigen [PSA]: Secondary | ICD-10-CM | POA: Insufficient documentation

## 2014-12-21 DIAGNOSIS — N400 Enlarged prostate without lower urinary tract symptoms: Secondary | ICD-10-CM

## 2014-12-21 DIAGNOSIS — N521 Erectile dysfunction due to diseases classified elsewhere: Secondary | ICD-10-CM | POA: Insufficient documentation

## 2014-12-21 SURGERY — CYSTOSCOPY WITH INSERTION OF UROLIFT
Anesthesia: General

## 2014-12-21 MED ORDER — KETOROLAC TROMETHAMINE 30 MG/ML IJ SOLN
INTRAMUSCULAR | Status: DC | PRN
  Administered 2014-12-21: 15 mg via INTRAVENOUS

## 2014-12-21 MED ORDER — PROPOFOL 10 MG/ML IV BOLUS
INTRAVENOUS | Status: AC
  Filled 2014-12-21: qty 20

## 2014-12-21 MED ORDER — PROPOFOL 10 MG/ML IV BOLUS
INTRAVENOUS | Status: DC | PRN
  Administered 2014-12-21: 180 mg via INTRAVENOUS

## 2014-12-21 MED ORDER — KETOROLAC TROMETHAMINE 30 MG/ML IJ SOLN
INTRAMUSCULAR | Status: AC
Start: 2014-12-21 — End: 2014-12-21
  Filled 2014-12-21: qty 1

## 2014-12-21 MED ORDER — FENTANYL CITRATE (PF) 100 MCG/2ML IJ SOLN
INTRAMUSCULAR | Status: AC
Start: 2014-12-21 — End: 2014-12-21
  Filled 2014-12-21: qty 2

## 2014-12-21 MED ORDER — LACTATED RINGERS IV SOLN: INTRAVENOUS | Status: DC

## 2014-12-21 MED ORDER — LACTATED RINGERS IV SOLN
INTRAVENOUS | Status: DC | PRN
  Administered 2014-12-21: 07:00:00 via INTRAVENOUS

## 2014-12-21 MED ORDER — CEPHALEXIN 500 MG PO CAPS: 500.0000 mg | ORAL_CAPSULE | Freq: Two times a day (BID) | ORAL | Status: DC

## 2014-12-21 MED ORDER — FENTANYL CITRATE (PF) 100 MCG/2ML IJ SOLN
INTRAMUSCULAR | Status: DC | PRN
  Administered 2014-12-21: 50 ug via INTRAVENOUS

## 2014-12-21 MED ORDER — FENTANYL CITRATE (PF) 100 MCG/2ML IJ SOLN: 25.0000 ug | INTRAMUSCULAR | Status: DC | PRN

## 2014-12-21 MED ORDER — TRAMADOL-ACETAMINOPHEN 37.5-325 MG PO TABS: 1.0000 | ORAL_TABLET | Freq: Four times a day (QID) | ORAL | Status: DC | PRN

## 2014-12-21 MED ORDER — ONDANSETRON HCL 4 MG/2ML IJ SOLN: 4.0000 mg | Freq: Once | INTRAMUSCULAR | Status: DC | PRN

## 2014-12-21 MED ORDER — FENTANYL CITRATE (PF) 100 MCG/2ML IJ SOLN
INTRAMUSCULAR | Status: AC
  Filled 2014-12-21: qty 2

## 2014-12-21 MED ORDER — SODIUM CHLORIDE 0.9 % IJ SOLN
INTRAMUSCULAR | Status: AC
  Filled 2014-12-21: qty 10

## 2014-12-21 MED ORDER — CEFAZOLIN SODIUM-DEXTROSE 2-3 GM-% IV SOLR
INTRAVENOUS | Status: AC
  Filled 2014-12-21: qty 50

## 2014-12-21 MED ORDER — STERILE WATER FOR IRRIGATION IR SOLN
Status: DC | PRN
  Administered 2014-12-21: 3000 mL

## 2014-12-21 MED ORDER — CEFAZOLIN SODIUM-DEXTROSE 2-3 GM-% IV SOLR
2.0000 g | INTRAVENOUS | Status: AC
  Administered 2014-12-21: 2 g via INTRAVENOUS

## 2014-12-21 SURGICAL SUPPLY — 13 items
BAG URO CATCHER STRL LF (DRAPE) ×2 IMPLANT
CATH INTERMIT  6FR 70CM (CATHETERS) ×2 IMPLANT
CLOTH BEACON ORANGE TIMEOUT ST (SAFETY) ×2 IMPLANT
GLOVE BIOGEL M STRL SZ7.5 (GLOVE) ×2 IMPLANT
GOWN STRL REUS W/TWL LRG LVL3 (GOWN DISPOSABLE) ×2 IMPLANT
GOWN STRL REUS W/TWL XL LVL3 (GOWN DISPOSABLE) ×2 IMPLANT
GUIDEWIRE STR DUAL SENSOR (WIRE) IMPLANT
MANIFOLD NEPTUNE II (INSTRUMENTS) ×2 IMPLANT
NS IRRIG 1000ML POUR BTL (IV SOLUTION) IMPLANT
PACK CYSTO (CUSTOM PROCEDURE TRAY) ×2 IMPLANT
SCRUB PCMX 4 OZ (MISCELLANEOUS) IMPLANT
SYSTEM UROLIFT (Male Continence) ×12 IMPLANT
TUBING CONNECTING 10 (TUBING) ×2 IMPLANT

## 2014-12-21 NOTE — Progress Notes (Signed)
O.K. For patient to go to Short Stay after patient has been in PACU  For a total of one hour- per Dr. Gentry RochJudd

## 2014-12-21 NOTE — Interval H&P Note (Signed)
History and Physical Interval Note:  12/21/2014 7:20 AM  Timothy Wheeler  has presented today for surgery, with the diagnosis of BPH  The various methods of treatment have been discussed with the patient and family. After consideration of risks, benefits and other options for treatment, the patient has consented to  Procedure(s): CYSTOSCOPY WITH INSERTION OF UROLIFT (N/A) as a surgical intervention .  The patient's history has been reviewed, patient examined, no change in status, stable for surgery.  I have reviewed the patient's chart and labs.  Questions were answered to the patient's satisfaction.     Kade Rickels I Weiland Tomich

## 2014-12-21 NOTE — Op Note (Signed)
Pre-operative diagnosis :   BPH ( N 40.1)  Postoperative diagnosis:  BPH  Operation:  Urolift Prostatic Urethral Lift  Surgeon:  S. Patsi Searsannenbaum, MD  First assistant:  None  Anesthesia:  General LMA  Preparation:  After appropriate preanesthesia, the patient was brought the operating, placed on the operating table in the dorsal supine position where general LMA anesthesia was introduced. He was replaced in the dorsal lithotomy position mild regrowth of the right lateral lobe at the level of the bladder neck. There was no median lobe noted. the pubis was prepped with Betadine solution and draped in usual fashion. He was given IV antibody. History was reviewed. It is noted that the patient has a history of microwave thermotherapy in 2010 with regrowth of his BPH, penile prosthesis implantation, with significant outflow obstruction, with IP assess score of 15, nocturia 3, PSA 1.1, despite maximum medical therapy. He has a prostate volume of 29 mL.  Office cystoscopy showed regrowth of the left lateral lobe of the prostate,  Review history:   Active Problems Problems  1. Benign prostatic hyperplasia with urinary obstruction (N40.1,N13.8)  Assessed By: Jethro Bolusannenbaum, Glori Machnik (Urology); Last Assessed: 07 Nov 2014 2. Elevated prostate specific antigen (PSA) (R97.2) 3. 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 Nocturia (R35.1)  History of Present Illness   74 year old male returns today for a cystoscopy, flowrate, PVR & PUS for possible Urolift candidate. Hx of BPH, elevated PSA, nocturia x 3 & ED. He was recommended to have a TURP in Oct. 2013, but he postponed because of wife's job problems (laid off).     He was previously in the DDAVP study in 2012 He is status post microwave thermotherapy in 2010 for BPH. He complains of urinary frequency, weak and intermittent flow, urinary straining, and nocturia x 3. His international prostate symptom score is 15 (normal 7).  Last PSA was 1.10 in Oct. 2010. The patient was placed on Avodart, but stopped the medication because of breast development, and peripheral neuropathy, controlled with Neurontin. He has a small aortic aneurysm, and a history of atrial fibrillation, and a penile prosthetic inflatable device.    Prostate ultrasound was accomplished on 03/23/12, showing a length of 4.30 cm, height of 2.0 cm, with a 4.44 cm, a volume of 29.01 cm.    Statement of  Likelihood of Success: Excellent. TIME-OUT observed.:  Procedure: 20 cystoscope was inserted through the urethra into the bladder. The urethra was normal in allsections. The cystoscopy bridge was replaced with the Urolift delivery device. Inspection of the prostate showed enlarged left lateral lobe, from the bladder neck to the veru. On the right side, there was an enlarged portion of the prostate lateral lobe at the level of bladder neck, but nicely retracted prostate toward the Veru. There was no median lobe noted. 2+ trabeculation was noted within the bladder. There was no evidence of bladder stone, tumor, or bladder diverticular formation.  The first treatment site was the patient's left side, 1.5 cm distal to the bladder neck. The distal tip of the delivery device was angled laterally, approximately 20 at this position, in order to compress the lateral lobe at the level of the bladder neck. The trigger was then pulled, deploying a needle containing the implant, and one end of the implant was delivered to the capsular surface of the prostate. The implant was then tensioned to assure capsular seating, and removal of slack monofilament. The device was then angled back towards the midline, and slowly advanced  proximalward, 3-4 mm, until the cystoscope was able to verify the monofilament being centered in the delivery bay. The urethral end piece was then affixed to the monofilament thereby tailoring the size of the implant. Excess filament was then severed. The  delivery device was then readvanced into the bladder, and then the delivery device was replaced with the cystoscope and bridge and the implant location and opening affect was confirmed cystoscopically. No bleeding was noted.  The same procedure was then repeated on the right side, at the same level, 1.57 cm distal to the bladder neck. Visual inspection revealed that there was still a large amount of left-sided prostate remaining, and I elected to bilateral implants at the level of the vera. Therefore, the scope was reloaded, and placed within the bladder. It was then retracted to the level of the Vero. The Vero was then tapped, and the scope angled 20 at the level of Vero, an implant placed in classic fashion on the left side, and then on the right side as described above. The right side appeared to be well retracted, but the left side needed to more implants, which were then implanted a proximally 1 cm apart, between the first implant and the veru..  A total of 5 implants were placed, 3 in the left lateral lobe, and two in the right lateral lobe.   The patient tolerated procedure well. There was no bleeding noted. I elected to not leave a Foley catheter. The patient will be observed for voiding trial in recovery room. He was given 15 mg of IV Toradol, awakened, and taken to recovery room in good condition.

## 2014-12-21 NOTE — Transfer of Care (Signed)
Immediate Anesthesia Transfer of Care Note  Patient: Timothy Wheeler  Procedure(s) Performed: Procedure(s): CYSTOSCOPY WITH INSERTION OF UROLIFT (N/A)  Patient Location: PACU  Anesthesia Type:General  Level of Consciousness: awake, alert  and patient cooperative  Airway & Oxygen Therapy: Patient Spontanous Breathing and Patient connected to face mask oxygen  Post-op Assessment: Report given to RN and Post -op Vital signs reviewed and stable  Post vital signs: Reviewed and stable  Last Vitals:  Filed Vitals:   12/21/14 0510  BP: 122/61  Pulse: 69  Temp: 36.5 C  Resp: 18    Complications: No apparent anesthesia complications

## 2014-12-21 NOTE — Anesthesia Procedure Notes (Signed)
Procedure Name: LMA Insertion Date/Time: 12/21/2014 7:32 AM Performed by: Paulla DollyJOYCE, Trai Ells A Pre-anesthesia Checklist: Patient identified, Emergency Drugs available, Suction available, Patient being monitored and Timeout performed Patient Re-evaluated:Patient Re-evaluated prior to inductionOxygen Delivery Method: Circle system utilized Preoxygenation: Pre-oxygenation with 100% oxygen Intubation Type: IV induction Ventilation: Mask ventilation without difficulty LMA: LMA with gastric port inserted LMA Size: 5.0 Number of attempts: 1 Placement Confirmation: positive ETCO2 and breath sounds checked- equal and bilateral Tube secured with: Tape Dental Injury: Teeth and Oropharynx as per pre-operative assessment

## 2014-12-21 NOTE — Anesthesia Postprocedure Evaluation (Signed)
  Anesthesia Post-op Note  Patient: Timothy Wheeler  Procedure(s) Performed: Procedure(s) (LRB): CYSTOSCOPY WITH INSERTION OF UROLIFT (N/A)  Patient Location: PACU  Anesthesia Type: General  Level of Consciousness: awake and alert   Airway and Oxygen Therapy: Patient Spontanous Breathing  Post-op Pain: mild  Post-op Assessment: Post-op Vital signs reviewed, Patient's Cardiovascular Status Stable, Respiratory Function Stable, Patent Airway and No signs of Nausea or vomiting  Last Vitals:  Filed Vitals:   12/21/14 0830  BP: 128/67  Pulse: 64  Temp:   Resp: 14    Post-op Vital Signs: stable   Complications: No apparent anesthesia complications   Last Vitals:  Filed Vitals:   12/21/14 0830  BP: 128/67  Pulse: 64  Temp:   Resp: 14    Complications: No apparent anesthesia complications

## 2014-12-21 NOTE — Progress Notes (Signed)
Up to bathroom  With assistance- voided 10 cc bloody urine.

## 2014-12-21 NOTE — Addendum Note (Signed)
Addendum  created 12/21/14 [id number] by Elesa MassedJoseph A Delmore Sear, CRNA   Modules edited: Anesthesia Attestations

## 2014-12-21 NOTE — Discharge Instructions (Signed)
Benign Prostatic Hyperplasia An enlarged prostate (benign prostatic hyperplasia) is common in older men. You may experience the following:  Weak urine stream.  Dribbling.  Feeling like the bladder has not emptied completely.  Difficulty starting urination.  Getting up frequently at night to urinate.  Urinating more frequently during the day. HOME CARE INSTRUCTIONS  Monitor your prostatic hyperplasia for any changes. The following actions may help to alleviate any discomfort you are experiencing:  Give yourself time when you urinate.  Stay away from alcohol.  Avoid beverages containing caffeine, such as coffee, tea, and colas, because they can make the problem worse.  Avoid decongestants, antihistamines, and some prescription medicines that can make the problem worse.  Follow up with your health care provider for further treatment as recommended. SEEK MEDICAL CARE IF:  You are experiencing progressive difficulty voiding.  Your urine stream is progressively getting narrower.  You are awaking from sleep with the urge to void more frequently.  You are constantly feeling the need to void.  You experience loss of urine, especially in small amounts. SEEK IMMEDIATE MEDICAL CARE IF:   You develop increased pain with urination or are unable to urinate.  You develop severe abdominal pain, vomiting, a high fever, or fainting.  You develop back pain or blood in your urine. MAKE SURE YOU:   Understand these instructions.  Will watch your condition.  Will get help right away if you are not doing well or get worse. Document Released: 06/09/2005 Document Revised: 02/09/2013 Document Reviewed: 11/09/2012 [facility] Patient Information 2015 [employer] [place]. This information is not intended to replace advice given to you by your health care provider. Make sure you discuss any questions you have with your health care provider. Next 2-5 days:  1. Occasional bloody discharge 2. Lower  abdominal soreness 3. Slight discomfort sitting 4. Urinary frequency, urgency   Push fluids.

## 2015-05-15 ENCOUNTER — Encounter: Payer: Self-pay | Admitting: Internal Medicine

## 2015-05-15 ENCOUNTER — Ambulatory Visit (INDEPENDENT_AMBULATORY_CARE_PROVIDER_SITE_OTHER): Payer: Medicare Other | Admitting: Internal Medicine

## 2015-05-15 VITALS — BP 124/66 | HR 79 | Ht 70.0 in | Wt 205.0 lb

## 2015-05-15 DIAGNOSIS — J449 Chronic obstructive pulmonary disease, unspecified: Secondary | ICD-10-CM

## 2015-05-15 NOTE — Progress Notes (Signed)
Subjective:    Patient ID: Timothy Wheeler, male    DOB: 1941-06-06, 74 y.o.   MRN: [id number]  HPI  21 yowm quit smoking  2007 referred 05/15/2015  by Dr Manson Passey in [place] for eval of cough/ sob better on prednisone   05/15/2015 1st [facility] office visit/ Timothy Wheeler   Chief Complaint  Patient presents with  . Pulmonary Consult    Pt last seen here in 2011. He c/o increased cough for the past 3 months. He also c/o increased DOE- does fine on flat surface but can not do any incline.     abrupt onset of symptoms with typical "head and chest cold "  x3 m prior to OV   Did fine x 2 both times after rx with pred despite symbicort maint (though hfa technique quit poor) - more sob when coughing, this improves also with prednisone  And using neb at least 3-4 times daily to control sob/ cough but not producing any purulent sputum. Cough worse at hs and in early am   No obvious patterns in day to day or daytime variability or assoc  cp or chest tightness, subjective wheeze or overt sinus or hb symptoms. No unusual exp hx or h/o childhood pna/ asthma or knowledge of premature birth.    Also denies any obvious fluctuation of symptoms with weather or environmental changes or other aggravating or alleviating factors except as outlined above   Current Medications, Allergies, Complete Past Medical History, Past Surgical History, Family History, and Social History were reviewed in [employer] record.           Past Medical History: Hypertension Hyperlipidemia Rheumatic fever as child Spinal stenosis  CAF....................................................[facility] [place]/ [facility]  - July 2011 dx'd with ok LV, leaking mitral valve COPD  - PFT's April 23, 2010 FEV 1 2.05 (69%) ratio 61 and no better p B2, DLCO 89% ? Pulmonary Fibrososi  - CT chest 10/29/09 and 03/30/10 minimal PF, no acute changes     Review of Systems  Constitutional:  Negative for fever, chills, activity change, appetite change and unexpected weight change.  HENT: Positive for sneezing. Negative for congestion, dental problem, postnasal drip, rhinorrhea, sore throat, trouble swallowing and voice change.   Eyes: Negative for visual disturbance.  Respiratory: Positive for cough and shortness of breath. Negative for choking.   Cardiovascular: Negative for chest pain and leg swelling.  Gastrointestinal: Negative for nausea, vomiting and abdominal pain.  Genitourinary: Negative for difficulty urinating.  Musculoskeletal: Negative for arthralgias.  Skin: Negative for rash.  Psychiatric/Behavioral: Negative for behavioral problems and confusion.       Objective:   Physical Exam  amb wm nad/ freq throat clearing   Wt Readings from Last 3 Encounters:  05/15/15 205 lb (92.987 kg)  12/21/14 191 lb (86.637 kg)  12/18/14 191 lb (86.637 kg)    Vital signs reviewed  HEENT: nl dentition, turbinates, and oropharynx. Nl external ear canals without cough reflex   NECK :  without JVD/Nodes/TM/ nl carotid upstrokes bilaterally   LUNGS: no acc muscle use, clear to A and P bilaterally without cough on insp or exp maneuvers   CV:  RRR  no s3 or murmur or increase in P2, no edema   ABD:  soft and nontender with nl excursion in the supine position. No bruits or organomegaly, bowel sounds nl  MS:  warm without deformities, calf tenderness, cyanosis or clubbing  SKIN: warm and dry without lesions  NEURO:  alert, approp, no deficits     cxr report from 03/17/15 Hyperinflation c/w copd/ no acute changes       Assessment & Plan:

## 2015-05-15 NOTE — Patient Instructions (Signed)
Plan A =  Automatic =  Symbicort 160 Take 2 puffs first thing in am and then another 2 puffs about 12 hours later                                      Try prilosec otc 20mg   Take 30-60 min before first meal of the day and Pepcid ac (famotidine) 20 mg one @  bedtime until  Return  Plan B = Backup - Only use your albuterol as a rescue medication to be used if you can't catch your breath by resting or doing a relaxed purse lip breathing pattern.  - The less you use it, the better it will work when you need it. - Ok to use up to every 4 hours if you must but call for immediate appointment if use goes up over your usual need - Don't leave home without it !!  (think of it like the spare tire for your car)   Work on inhaler technique:  relax and gently blow all the way out then take a nice smooth deep breath back in, triggering the inhaler at same time you start breathing in.  Hold for up to 5 seconds if you can. Blow out thru nose. Rinse and gargle with water when done  GERD (REFLUX)  is an extremely common cause of respiratory symptoms just like yours , many times with no obvious heartburn at all.    It can be treated with medication, but also with lifestyle changes including elevation of the head of your bed (ideally with 6 inch  bed blocks),  Smoking cessation, avoidance of late meals, excessive alcohol, and avoid fatty foods, chocolate, peppermint, colas, red wine, and acidic juices such as orange juice.  NO MINT OR MENTHOL PRODUCTS SO NO COUGH DROPS  USE SUGARLESS CANDY INSTEAD (Jolley ranchers or Stover's or Life Savers) or even ice chips will also do - the key is to swallow to prevent all throat clearing. NO OIL BASED VITAMINS - use powdered substitutes.    Please schedule a follow up office visit in 2 weeks, sooner if needed

## 2015-05-16 ENCOUNTER — Encounter: Payer: Self-pay | Admitting: Internal Medicine

## 2015-05-16 DIAGNOSIS — J449 Chronic obstructive pulmonary disease, unspecified: Secondary | ICD-10-CM | POA: Insufficient documentation

## 2015-05-16 NOTE — Assessment & Plan Note (Signed)
Quit smoking 2007 - PFT's April 23, 2010 FEV 1 2.05 (69%) ratio 61 and no better p B2, DLCO 89% - 05/15/2015  extensive coaching HFA effectiveness =   50% from a baseline of 25%   DDX of  difficult airways management all start with A and  include Adherence, Ace Inhibitors, Acid Reflux, Active Sinus Disease, Alpha 1 Antitripsin deficiency, Anxiety masquerading as Airways dz,  ABPA,  allergy(esp in young), Aspiration (esp in elderly), Adverse effects of meds,  Active smokers, A bunch of PE's (a small clot burden can't cause this syndrome unless there is already severe underlying pulm or vascular dz with poor reserve) plus two Bs  = Bronchiectasis and Beta blocker use..and one C= CHF  Adherence is always the initial "prime suspect" and is a multilayered concern that requires a "trust but verify" approach in every patient - starting with knowing how to use medications, especially inhalers, correctly, keeping up with refills and understanding the fundamental difference between maintenance and prns vs those medications only taken for a very short course and then stopped and not refilled.  In this case Adherence is the biggest issue and starts with  inability to use HFA effectively and also  understand that SABA treats the symptoms but doesn't get to the underlying problem (inflammation).  I used  the analogy of putting steroid cream on a rash to help explain the meaning of topical therapy and the need to get the drug to the target tissue.   - The proper method of use, as well as anticipated side effects, of a metered-dose inhaler are discussed and demonstrated to the patient. Improved effectiveness after extensive coaching during this visit to a level of approximately  50%. He is very steroid responsive with systemic rx and would probably respond well to symbicort if he can learn to use is effectively.  ? Acid (or non-acid) GERD > always difficult to exclude as up to 75% of pts in some series report no  assoc GI/ Heartburn symptoms> rec max (24h)  acid suppression and diet restrictions/ reviewed and instructions given in writing.  ? Allergy > taper off prednisone again and see if flares and if so can pursue this possiblilty  ? Active sinus CT > perhaps this is next step if can't keep him better  I had an extended discussion with the patient reviewing all relevant studies completed to date and  lasting 35 minutes of a 60 minute visit    Each maintenance medication was reviewed in detail including most importantly the difference between maintenance and prns and under what circumstances the prns are to be triggered using an action plan format that is not reflected in the computer generated alphabetically organized AVS.    Please see instructions for details which were reviewed in writing and the patient given a copy highlighting the part that I personally wrote and discussed at today's ov.

## 2015-05-30 ENCOUNTER — Encounter: Payer: Self-pay | Admitting: Internal Medicine

## 2015-06-11 ENCOUNTER — Ambulatory Visit (INDEPENDENT_AMBULATORY_CARE_PROVIDER_SITE_OTHER): Payer: Medicare Other | Admitting: Internal Medicine

## 2015-06-11 ENCOUNTER — Encounter: Payer: Self-pay | Admitting: Internal Medicine

## 2015-06-11 VITALS — BP 130/74 | HR 73 | Ht 70.0 in | Wt 207.0 lb

## 2015-06-11 DIAGNOSIS — R05 Cough: Secondary | ICD-10-CM | POA: Diagnosis not present

## 2015-06-11 DIAGNOSIS — R058 Other specified cough: Secondary | ICD-10-CM

## 2015-06-11 DIAGNOSIS — J449 Chronic obstructive pulmonary disease, unspecified: Secondary | ICD-10-CM | POA: Diagnosis not present

## 2015-06-11 MED ORDER — BUDESONIDE-FORMOTEROL FUMARATE 80-4.5 MCG/ACT IN AERO
INHALATION_SPRAY | RESPIRATORY_TRACT | Status: DC
Start: 2015-06-11 — End: 2015-09-17

## 2015-06-11 NOTE — Progress Notes (Signed)
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 Pulmonary Consult    Pt last seen here in 2011. He c/o increased cough for the past 3 months. He also c/o increased DOE- does fine on flat surface but can not do any incline.   abrupt onset of symptoms with typical "head and chest cold "  X 3 m prior to OV   Did fine x 2 both times after rx with pred despite symbicort maint (though hfa technique quite poor) - more sob when coughing, this improves also with prednisone  And using neb at least 3-4 times daily to control sob/ cough but not producing any purulent sputum. Cough worse at hs and in early am rec Plan A =  Automatic =  Symbicort 160 Take 2 puffs first thing in am and then another 2 puffs about 12 hours later                                      Try prilosec otc 20mg   Take 30-60 min before first meal of the day and Pepcid ac (famotidine) 20 mg one @  bedtime until  Return Plan B = Backup - Only use your albuterol as a rescue medication   Work on inhaler technique:   GERD  Diet     06/11/2015  f/u ov/[facility] re: GOLD II / uacs  Chief Complaint  Patient presents with  . Follow-up    Pt states that his breathing is "great" and his cough has resolved. No new co's today.    MMRC1 = can walk nl pace, flat grade, can't hurry or go uphills or steps s sob   Main c/o = tickle in throat daytime s excess/ purulent sputum or mucus plugs   Much less saba use on present rx = symb 160 bid and gerd rx   No obvious day to day or daytime variability or assoc  excess/ purulent sputum or mucus plugs    or cp or chest tightness, subjective wheeze or overt  sinus or hb symptoms. No unusual exp hx or h/o childhood pna/ asthma or knowledge of premature birth.  Sleeping ok without nocturnal  or early am exacerbation  of respiratory  c/o's or need for noct saba. Also denies any obvious fluctuation of symptoms with weather or environmental changes or other aggravating or alleviating factors except as outlined above   Current Medications, Allergies, Complete Past Medical History, Past Surgical History, Family History, and Social History were reviewed in [facility] electronic medical record.  ROS  The following are not active complaints unless bolded sore throat, dysphagia, dental problems, itching, sneezing,  nasal congestion or excess/ purulent secretions, ear ache,   fever, chills, sweats, unintended wt loss, classically pleuritic or exertional cp, hemoptysis,  orthopnea pnd or leg swelling, presyncope, palpitations, abdominal pain, anorexia, nausea, vomiting, diarrhea  or change in bowel or bladder habits, change in stools or urine, dysuria,hematuria,  rash, arthralgias, visual complaints, headache, numbness, weakness or  ataxia or problems with walking or coordination,  change in mood/affect or memory.              Past Medical History: Hypertension Hyperlipidemia Rheumatic fever as child Spinal stenosis  CAF....................................................[facility] Kitchen[facility]encar [place]/ [facility]  - July 2011 dx'd with ok LV, leaking mitral valve COPD  - PFT's April 23, 2010 FEV 1 2.05 (69%) ratio 61 and no better p B2, DLCO 89% ? Pulmonary Fibrososis  - CT chest 10/29/09 and 03/30/10 minimal PF, no acute changes         Objective:   Physical Exam  amb wm nad/ freq throat clearing continues but not as severe   06/11/2015     208   05/15/15 205 lb (92.987 kg)  12/21/14 191 lb (86.637 kg)  12/18/14 191 lb (86.637 kg)    Vital signs reviewed  HEENT: nl dentition, turbinates, and oropharynx. Nl external ear canals  without cough reflex   NECK :  without JVD/Nodes/TM/ nl carotid upstrokes bilaterally   LUNGS: no acc muscle use, clear to A and P bilaterally without cough on insp or exp maneuvers   CV:  RRR  no s3 or murmur or increase in P2, no edema   ABD:  soft and nontender with nl excursion in the supine position. No bruits or organomegaly, bowel sounds nl  MS:  warm without deformities, calf tenderness, cyanosis or clubbing  SKIN: warm and dry without lesions    NEURO:  alert, approp, no deficits     cxr report from 03/17/15 Hyperinflation c/w copd/ no acute changes       Assessment & Plan:

## 2015-06-11 NOTE — Patient Instructions (Addendum)
Plan A =  Automatic =  Symbicort 80 Take 2 puffs first thing in am and then another 2 puffs about 12 hours later                                      Continue  prilosec otc 20mg   Take 30-60 min before first meal of the day and Pepcid ac (famotidine) 20 mg one @  bedtime until  Return  Plan B = Backup - Only use your albuterol as a rescue medication to be used if you can't catch your breath by resting or doing a relaxed purse lip breathing pattern.  - The less you use it, the better it will work when you need it. - Ok to use up to every 4 hours if you must but call for immediate appointment if use goes up over your usual need - Don't leave home without it !!  (think of it like the spare tire for your car)   For drainage / throat tickle try take CHLORPHENIRAMINE  4 mg - take one every 4 hours as needed - available over the counter- may cause drowsiness so start with just a bedtime dose or two and see how you tolerate it before trying in daytime    Please schedule a follow up office visit in 3 month  call sooner if needed with pfts

## 2015-06-13 DIAGNOSIS — R05 Cough: Secondary | ICD-10-CM | POA: Insufficient documentation

## 2015-06-13 DIAGNOSIS — R058 Other specified cough: Secondary | ICD-10-CM | POA: Insufficient documentation

## 2015-06-13 NOTE — Assessment & Plan Note (Addendum)
Classic Upper airway cough syndrome, so named because it's frequently impossible to sort out how much is  CR/sinusitis with freq throat clearing (which can be related to primary GERD)   vs  causing  secondary (" extra esophageal")  GERD from wide swings in gastric pressure that occur with throat clearing, often  promoting self use of mint and menthol lozenges that reduce the lower esophageal sphincter tone and exacerbate the problem further in a cyclical fashion.   These are the same pts (now being labeled as having "irritable larynx syndrome" by some cough centers) who not infrequently have a history of having failed to tolerate ace inhibitors,  dry powder inhalers(and sometimes the higher dose hfa ics as well, which may prove to be the case here)  or biphosphonates or report having atypical reflux symptoms that don't respond to standard doses of PPI , and are easily confused as having aecopd or asthma flares by even experienced allergists/ pulmonologists.   Try 1st gen h1/ diet reviewed > avs

## 2015-06-13 NOTE — Assessment & Plan Note (Addendum)
Quit smoking 2007 - PFT's April 23, 2010 FEV 1 2.05 (69%) ratio 61 and no better p B2, DLCO 89%  - 06/11/2015  extensive coaching HFA effectiveness =    75% from a baseline of 50%   Clearly better except for upper airway pattern cough > see uacs  I had an extended discussion with the patient reviewing all relevant studies completed to date and  lasting 15 to 20 minutes of a 25 minute visit    1) sometimes in pulmonary medicine less is more. That is, if he would learn how to inhale his medicine more effectively then we can actually  reduce the dosage and likely  Irritate his throat probably a lot less > try Symbicort 80 dose 2 puffs every 12 hours and see if he notices any difference and if not recommend he stay on the lower dose due to uacs (see sep a/p)  2) Each maintenance medication was reviewed in detail including most importantly the difference between maintenance and prns and under what circumstances the prns are to be triggered using an action plan format that is not reflected in the computer generated alphabetically organized AVS.    Please see instructions for details which were reviewed in writing and the patient given a copy highlighting the part that I personally wrote and discussed at today's ov.

## 2015-09-06 ENCOUNTER — Ambulatory Visit: Payer: Medicare Other | Admitting: Internal Medicine

## 2015-09-17 ENCOUNTER — Ambulatory Visit (INDEPENDENT_AMBULATORY_CARE_PROVIDER_SITE_OTHER)
Admission: RE | Admit: 2015-09-17 | Discharge: 2015-09-17 | Disposition: A | Discharge status: Home / Self Care | Payer: Medicare Other | Source: Ambulatory Visit | Attending: Internal Medicine | Admitting: Internal Medicine

## 2015-09-17 ENCOUNTER — Ambulatory Visit (INDEPENDENT_AMBULATORY_CARE_PROVIDER_SITE_OTHER): Payer: Medicare Other | Admitting: Internal Medicine

## 2015-09-17 ENCOUNTER — Encounter: Payer: Self-pay | Admitting: Internal Medicine

## 2015-09-17 VITALS — BP 118/62 | HR 77 | Ht 70.25 in | Wt 199.0 lb

## 2015-09-17 DIAGNOSIS — J189 Pneumonia, unspecified organism: Secondary | ICD-10-CM

## 2015-09-17 DIAGNOSIS — R058 Other specified cough: Secondary | ICD-10-CM

## 2015-09-17 DIAGNOSIS — R05 Cough: Secondary | ICD-10-CM | POA: Diagnosis not present

## 2015-09-17 DIAGNOSIS — J449 Chronic obstructive pulmonary disease, unspecified: Secondary | ICD-10-CM

## 2015-09-17 LAB — PULMONARY FUNCTION TEST
DL/VA % PRED: 78 %
DL/VA: 3.6 ml/min/mmHg/L
DLCO cor % pred: 76 %
DLCO cor: 25.14 ml/min/mmHg
DLCO unc % pred: 73 %
DLCO unc: 23.93 ml/min/mmHg
FEF 25-75 PRE: 1.07 L/s
FEF 25-75 Post: 1.99 L/sec
FEF2575-%CHANGE-POST: 86 %
FEF2575-%PRED-PRE: 46 %
FEF2575-%Pred-Post: 87 %
FEV1-%Change-Post: 18 %
FEV1-%PRED-PRE: 69 %
FEV1-%Pred-Post: 81 %
FEV1-PRE: 2.16 L
FEV1-Post: 2.55 L
FEV1FVC-%CHANGE-POST: 1 %
FEV1FVC-%Pred-Pre: 87 %
FEV6-%CHANGE-POST: 18 %
FEV6-%PRED-POST: 95 %
FEV6-%PRED-PRE: 80 %
FEV6-Post: 3.86 L
FEV6-Pre: 3.27 L
FEV6FVC-%CHANGE-POST: 0 %
FEV6FVC-%Pred-Post: 103 %
FEV6FVC-%Pred-Pre: 102 %
FVC-%CHANGE-POST: 17 %
FVC-%PRED-POST: 92 %
FVC-%Pred-Pre: 78 %
FVC-PRE: 3.38 L
FVC-Post: 3.96 L
POST FEV6/FVC RATIO: 97 %
PRE FEV1/FVC RATIO: 64 %
Post FEV1/FVC ratio: 65 %
Pre FEV6/FVC Ratio: 97 %
RV % PRED: 185 %
RV: 4.73 L
TLC % pred: 120 %
TLC: 8.54 L

## 2015-09-17 MED ORDER — BUDESONIDE-FORMOTEROL FUMARATE 160-4.5 MCG/ACT IN AERO: INHALATION_SPRAY | RESPIRATORY_TRACT | Status: DC

## 2015-09-17 NOTE — Assessment & Plan Note (Signed)
Quit smoking 2007 - PFT's April 23, 2010 FEV 1 2.05 (69%) ratio 61 and no better p B2, DLCO 89% - PFT's  09/17/2015  FEV1 2.55 (81 % ) ratio 65  p 18 % improvement from saba p symb 80 x2 in am  prior to study with DLCO  73/76 % corrects to 78 % for alv volume     - 09/17/2015  extensive coaching HFA effectiveness =    75% from a baseline of 50% > try symbicort 160 2bid  With so much reversiblity on the 80 dose need to rechallenge with the 160 and if not able to tolerate then consider adding LABA pref in respimat format here to reduce likelihood of disturbing the cough   I had an extended discussion with the patient reviewing all relevant studies completed to date and  lasting 15 to 20 minutes of a 25 minute visit    Each maintenance medication was reviewed in detail including most importantly the difference between maintenance and prns and under what circumstances the prns are to be triggered using an action plan format that is not reflected in the computer generated alphabetically organized AVS.    Please see instructions for details which were reviewed in writing and the patient given a copy highlighting the part that I personally wrote and discussed at today's ov.

## 2015-09-17 NOTE — Patient Instructions (Addendum)
Please remember to go to the x-ray department downstairs for your tests - we will call you with the results when they are available.  Increase symbicort to 160 Take 2 puffs first thing in am and then another 2 puffs about 12 hours later.    Work on inhaler technique:  relax and gently blow all the way out then take a nice smooth deep breath back in, triggering the inhaler at same time you start breathing in.  Hold for up to 5 seconds if you can. Blow out thru nose. Rinse and gargle with water when done  Try prilosec (omeprazole)  otc 20mg   Take 30-60 min before first meal of the day and at  Pepcid ac (famotidine) 20 mg one @  bedtime until return here  GERD (REFLUX)  is an extremely common cause of respiratory symptoms just like yours , many times with no obvious heartburn at all.    It can be treated with medication, but also with lifestyle changes including elevation of the head of your bed (ideally with 6 inch  bed blocks),  Smoking cessation, avoidance of late meals, excessive alcohol, and avoid fatty foods, chocolate, peppermint, colas, red wine, and acidic juices such as orange juice.  NO MINT OR MENTHOL PRODUCTS SO NO COUGH DROPS  USE SUGARLESS CANDY INSTEAD (Jolley ranchers or Stover's or Life Savers) or even ice chips will also do - the key is to swallow to prevent all throat clearing. NO OIL BASED VITAMINS - use powdered substitutes.   Please schedule a follow up office visit in 6 weeks, call sooner if needed

## 2015-09-17 NOTE — Progress Notes (Signed)
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 Pulmonary Consult    Pt last seen here in 2011. He c/o increased cough for the past 3 months. He also c/o increased DOE- does fine on flat surface but can not do any incline.   abrupt onset of symptoms with typical "head and chest cold "  X 3 m prior to OV   Did fine x 2 both times after rx with pred despite symbicort maint (though hfa technique quite poor) - more sob when coughing, this improves also with prednisone  And using neb at least 3-4 times daily to control sob/ cough but not producing any purulent sputum. Cough worse at hs and in early am rec Plan A =  Automatic =  Symbicort 160 Take 2 puffs first thing in am and then another 2 puffs about 12 hours later                                      Try prilosec otc 20mg   Take 30-60 min before first meal of the day and Pepcid ac (famotidine) 20 mg one @  bedtime until  Return Plan B = Backup - Only use your albuterol as a rescue medication   Work on inhaler technique:   GERD  Diet     06/11/2015  f/u ov/Briceyda Abdullah re: GOLD II / uacs  Chief Complaint  Patient presents with  . Follow-up    Pt states that his breathing is "great" and his cough has resolved. No new co's today.    MMRC1 = can walk nl pace, flat grade, can't hurry or go uphills or steps s sob   Main c/o = tickle in throat daytime s excess/ purulent sputum or mucus plugs   Much less saba use on present rx = symb 160 bid and gerd rx rec Plan A =  Automatic =  Symbicort 80 Take 2 puffs first thing in am and then another 2 puffs about 12 hours later     Continue  prilosec otc 20mg   Take 30-60 min before first meal of the day and Pepcid ac (famotidine) 20 mg one @  bedtime until  Return Plan B = Backup - Only use your albuterol as a rescue medication  For drainage / throat tickle try take CHLORPHENIRAMINE  4 mg as needed    09/17/2015  f/u ov/Thomasa Heidler re:  GOLD I copd/ maint symbicort 80 on ppi never started pepcid  Chief Complaint  Patient presents with  . Follow-up    PFT done today. Pt states shortly after last visit, he developed cough again and was dxed with PNA. He states that this has happened 3 x since last visit.  Today, his breathing is doing well and he denies any cough or other co's.   onset of first "pna" heralded by sore throat/ mostly dry cough/ fever but since then just recurrent cough s clear coryzal symptoms  Baseline doe =  MMRC1 = can walk  nl pace, flat grade, can't hurry or go uphills or steps s sob    No obvious day to day or daytime variability or assoc  excess/ purulent sputum or mucus plugs    or cp or chest tightness, subjective wheeze or overt sinus or hb symptoms. No unusual exp hx or h/o childhood pna/ asthma or knowledge of premature birth.  Sleeping ok without nocturnal  or early am exacerbation  of respiratory  c/o's or need for noct saba. Also denies any obvious fluctuation of symptoms with weather or environmental changes or other aggravating or alleviating factors except as outlined above   Current Medications, Allergies, Complete Past Medical History, Past Surgical History, Family History, and Social History were reviewed in [employer] record.  ROS  The following are not active complaints unless bolded sore throat, dysphagia, dental problems, itching, sneezing,  nasal congestion or excess/ purulent secretions, ear ache,   fever, chills, sweats, unintended wt loss, classically pleuritic or exertional cp, hemoptysis,  orthopnea pnd or leg swelling, presyncope, palpitations, abdominal pain,  anorexia, nausea, vomiting, diarrhea  or change in bowel or bladder habits, change in stools or urine, dysuria,hematuria,  rash, arthralgias, visual complaints, headache, numbness, weakness or ataxia or problems with walking or coordination,  change in mood/affect or memory.              Past Medical History: Hypertension Hyperlipidemia Rheumatic fever as child Spinal stenosis  CAF....................................................[facility]land Kitchen[facility]encar [facility]/ [facility]  - July 2011 dx'd with ok LV, leaking mitral valve COPD  - PFT's April 23, 2010 FEV 1 2.05 (69%) ratio 61 and no better p B2, DLCO 89% ? Pulmonary Fibrososis  - CT chest 10/29/09 and 03/30/10 minimal PF, no acute changes         Objective:   Physical Exam  amb wm nad   09/17/2015       199  06/11/2015     208   05/15/15 205 lb (92.987 kg)  12/21/14 191 lb (86.637 kg)  12/18/14 191 lb (86.637 kg)    Vital signs reviewed  HEENT: nl dentition, turbinates, and oropharynx. Nl external ear canals without cough reflex   NECK :  without JVD/Nodes/TM/ nl carotid upstrokes bilaterally   LUNGS: no acc muscle use, clear to A and P bilaterally without cough on insp or exp maneuvers   CV:  RRR  no s3 or murmur or increase in P2, no edema   ABD:  soft and nontender with nl excursion in the supine position. No bruits or organomegaly, bowel sounds nl  MS:  warm without deformities, calf tenderness, cyanosis or clubbing  SKIN: warm and dry without lesions    NEURO:  alert, approp, no deficits      CXR PA and Lateral:   09/17/2015 :    I personally reviewed images and agree with radiology impression as follows:   Minimal increase markings RML     Assessment & Plan:

## 2015-09-17 NOTE — Assessment & Plan Note (Signed)
Clear on cxr 09/17/2015 > no further f/u indicated

## 2015-09-17 NOTE — Assessment & Plan Note (Signed)
Did not take pepcid as rec  Discussed:  The standardized cough guidelines published in Chest by [employer] Falls[employer] Irwin in 2006 are still the best available and consist of a multiple step process (up to 12!) , not a single office visit,  and are intended  to address this problem logically,  with an alogrithm dependent on response to empiric treatment at  each progressive step  to determine a specific diagnosis with  minimal addtional testing needed. Therefore if adherence is an issue or can't be accurately verified,  it's very unlikely the standard evaluation and treatment will be successful here.    Furthermore, response to therapy (other than acute cough suppression, which should only be used short term with avoidance of narcotic containing cough syrups if possible), can be a gradual process for which the patient may perceive immediate benefit.  Unlike going to an eye doctor where the best perscription is almost always the first one and is immediately effective, this is almost never the case in the management of chronic cough syndromes. Therefore the patient needs to commit up front to consistently adhere to recommendations  for up to 6 weeks of therapy directed at the likely underlying problem(s) before the response can be reasonably evaluated.   Will retry max rx for GERD/diet / f/u in 6 weeks

## 2015-09-17 NOTE — Progress Notes (Signed)
PFT done today. 

## 2015-09-18 NOTE — Progress Notes (Signed)
Quick Note:  Spoke with pt and notified of results per Dr. Wert. Pt verbalized understanding and denied any questions.  ______ 

## 2015-10-29 ENCOUNTER — Encounter: Payer: Self-pay | Admitting: Internal Medicine

## 2015-10-29 ENCOUNTER — Ambulatory Visit (INDEPENDENT_AMBULATORY_CARE_PROVIDER_SITE_OTHER): Payer: Medicare Other | Admitting: Internal Medicine

## 2015-10-29 ENCOUNTER — Ambulatory Visit (INDEPENDENT_AMBULATORY_CARE_PROVIDER_SITE_OTHER)
Admission: RE | Admit: 2015-10-29 | Discharge: 2015-10-29 | Disposition: A | Discharge status: Home / Self Care | Payer: Medicare Other | Source: Ambulatory Visit | Attending: Internal Medicine | Admitting: Internal Medicine

## 2015-10-29 VITALS — BP 118/62 | HR 68 | Ht 70.25 in | Wt 194.0 lb

## 2015-10-29 DIAGNOSIS — J189 Pneumonia, unspecified organism: Secondary | ICD-10-CM | POA: Diagnosis not present

## 2015-10-29 DIAGNOSIS — J449 Chronic obstructive pulmonary disease, unspecified: Secondary | ICD-10-CM

## 2015-10-29 NOTE — Assessment & Plan Note (Addendum)
Quit smoking 2007 - PFT's April 23, 2010 FEV 1 2.05 (69%) ratio 61 and no better p B2, DLCO 89% - PFT's  09/17/2015  FEV1 2.55 (81 % ) ratio 65  p 18 % improvement from saba p symb 80 x2 in am  prior to study with DLCO  73/76 % corrects to 78 % for alv volume   - 09/17/2015  extensive coaching HFA effectiveness =    75% from a baseline of 50% > try symbicort 160 2bid > improved 10/29/2015   I had an extended final summary discussion with the patient reviewing all relevant studies completed to date and  lasting 15 to 20 minutes of a 25 minute visit on the following issues:    1)  I  reviewed the Fletcher curve with the patient that basically indicates  if you quit smoking when your best day FEV1 is still well preserved (as is clearly  the case here)  it is highly unlikely you will progress to severe disease and informed the patient there was no medication on the market that has proven to alter the curve/ its downward trajectory  or the likelihood of progression of their disease.  Therefore stopping smoking and maintaining abstinence is the most important aspect of care, not choice of inhalers or for that matter, doctors.     2) doing fine on symbicort 160 2bid so pulmonary f/u is prn   3) Each maintenance medication was reviewed in detail including most importantly the difference between maintenance and as needed and under what circumstances the prns are to be used.  Please see instructions for details which were reviewed in writing and the patient given a copy.

## 2015-10-29 NOTE — Patient Instructions (Signed)
Please remember to go to the   x-ray department downstairs for your tests - we will call you with the results when they are available.   If you are satisfied with your treatment plan,  let your doctor know and he/she can either refill your medications or you can return here when your prescription runs out.     If in any way you are not 100% satisfied,  please tell us.  If 100% better, tell your friends!  Pulmonary follow up is as needed        

## 2015-10-29 NOTE — Progress Notes (Signed)
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 Pulmonary Consult    Pt last seen here in 2011. He c/o increased cough for the past 3 months. He also c/o increased DOE- does fine on flat surface but can not do any incline.   abrupt onset of symptoms with typical "head and chest cold "  X 3 m prior to OV   Did fine x 2 both times after rx with pred despite symbicort maint (though hfa technique quite poor) - more sob when coughing, this improves also with prednisone  And using neb at least 3-4 times daily to control sob/ cough but not producing any purulent sputum. Cough worse at hs and in early am rec Plan A =  Automatic =  Symbicort 160 Take 2 puffs first thing in am and then another 2 puffs about 12 hours later                                      Try prilosec otc 20mg   Take 30-60 min before first meal of the day and Pepcid ac (famotidine) 20 mg one @  bedtime until  Return Plan B = Backup - Only use your albuterol as a rescue medication   Work on inhaler technique:   GERD  Diet     06/11/2015  f/u ov/Wert re: GOLD II / uacs  Chief Complaint  Patient presents with  . Follow-up    Pt states that his breathing is "great" and his cough has resolved. No new co's today.    MMRC1 = can walk nl pace, flat grade, can't hurry or go uphills or steps s sob   Main c/o = tickle in throat daytime s excess/ purulent sputum or mucus plugs   Much less saba use on present rx = symb 160 bid and gerd rx rec Plan A =  Automatic =  Symbicort 80 Take 2 puffs first thing in am and then another 2 puffs about 12  hours later                                      Continue  prilosec otc 20mg   Take 30-60 min before first meal of the day and Pepcid ac (famotidine) 20 mg one @  bedtime until  Return Plan B = Backup - Only use your albuterol as a rescue medication  For drainage / throat tickle try take CHLORPHENIRAMINE  4 mg as needed    09/17/2015  f/u ov/Wert re:  GOLD I copd/ maint symbicort 80 on ppi never started pepcid  Chief Complaint  Patient presents with  . Follow-up    PFT done today. Pt states shortly after last visit, he developed cough again and was dxed with PNA. He states that this has happened 3 x since last visit.  Today, his breathing is  doing well and he denies any cough or other co's.   onset of first "pna" heralded by sore throat/ mostly dry cough/ fever but since then just recurrent cough s clear coryzal symptoms  Baseline doe =  MMRC1 = can walk nl pace, flat grade, can't hurry or go uphills or steps s sob   rec Please remember to go to the x-ray department downstairs for your tests - we will call you with the results when they are available. Increase symbicort to 160 Take 2 puffs first thing in am and then another 2 puffs about 12 hours later.  Work on inhaler technique:  Try prilosec (omeprazole)  otc   Take 30-60 min before first meal of the day and at  Pepcid ac (famotidine) 20 mg one @  bedtime until return here GERD diet    10/29/2015  f/u ov/Wert re: GOLD I  Copd/ symbicort 160 2bid/ no longer on gerd rx s recurrence of cough at all  Chief Complaint  Patient presents with  . Follow-up    Pt states that his breathing is "doing great". Pt denies cough/wheeze/SOB/CP/tightness.   only sob using his arms eg lifting heavy objects like trying to lift a trailor onto a hitch Doe = MMRC1 = can walk nl pace, flat grade, can't hurry or go uphills or steps s sob     No obvious day to day or daytime variability or assoc  excess/ purulent sputum or mucus plugs    or cp or chest  tightness, subjective wheeze or overt sinus or hb symptoms. No unusual exp hx or h/o childhood pna/ asthma or knowledge of premature birth.  Sleeping ok without nocturnal  or early am exacerbation  of respiratory  c/o's or need for noct saba. Also denies any obvious fluctuation of symptoms with weather or environmental changes or other aggravating or alleviating factors except as outlined above   Current Medications, Allergies, Complete Past Medical History, Past Surgical History, Family History, and Social History were reviewed in [employer] record.  ROS  The following are not active complaints unless bolded sore throat, dysphagia, dental problems, itching, sneezing,  nasal congestion or excess/ purulent secretions, ear ache,   fever, chills, sweats, unintended wt loss, classically pleuritic or exertional cp, hemoptysis,  orthopnea pnd or leg swelling, presyncope, palpitations, abdominal pain, anorexia, nausea, vomiting, diarrhea  or change in bowel or bladder habits, change in stools or urine, dysuria,hematuria,  rash, arthralgias, visual complaints, headache, numbness, weakness or ataxia or problems with walking or coordination,  change in mood/affect or memory.          Past Medical History: Hypertension Hyperlipidemia Rheumatic fever as child Spinal stenosis  CAF....................................................Marland KitchenRevencar Cataract/ [facility]  - July 2011 dx'd with ok LV, leaking mitral valve COPD  - PFT's April 23, 2010 FEV 1 2.05 (69%) ratio 61 and no better p B2, DLCO 89% ? Pulmonary Fibrososis  - CT chest 10/29/09 and 03/30/10 minimal PF, no acute changes       Objective:   Physical Exam  amb wm nad all smiles   09/17/2015       199 > 10/29/2015   194  06/11/2015     208   05/15/15 205 lb (92.987 kg)  12/21/14 191 lb (86.637 kg)  12/18/14 191 lb (86.637 kg)    Vital signs reviewed  HEENT: nl dentition, turbinates, and oropharynx.  Nl external ear canals without cough reflex   NECK :  without JVD/Nodes/TM/ nl carotid  upstrokes bilaterally   LUNGS: no acc muscle use, clear to A and P bilaterally without cough on insp or exp maneuvers   CV:  RRR  no s3 or murmur or increase in P2, no edema   ABD:  soft and nontender with nl excursion in the supine position. No bruits or organomegaly, bowel sounds nl  MS:  warm without deformities, calf tenderness, cyanosis or clubbing  SKIN: warm and dry without lesions    NEURO:  alert, approp, no deficits    CXR PA and Lateral:   10/29/2015 :    I personally reviewed images and agree with radiology impression as follows:   There is hyperinflation of the lungs compatible with COPD. Linear densities at the lung bases, likely scarring, similar prior study. Heart is normal size. Tortuosity and calcifications within the thoracic aorta. No effusions or acute bony abnormality.      Assessment & Plan:

## 2015-10-31 NOTE — Assessment & Plan Note (Signed)
Clear on cxr 09/17/2015 x for min RML streaky atx> repeat 10/29/2015 > resolved, no f/u needed

## 2016-01-23 ENCOUNTER — Telehealth: Payer: Self-pay

## 2016-01-23 ENCOUNTER — Ambulatory Visit: Payer: Medicare Other | Admitting: Neurology

## 2016-01-23 NOTE — Telephone Encounter (Signed)
Pt called saying that he somehow ended up in [place]. They had the wrong address and will not be able to make today's 1130 appt. Re-scheduled to tomorrow afternoon.

## 2016-01-24 ENCOUNTER — Encounter: Payer: Self-pay | Admitting: Neurology

## 2016-01-24 ENCOUNTER — Ambulatory Visit (INDEPENDENT_AMBULATORY_CARE_PROVIDER_SITE_OTHER): Payer: Medicare Other | Admitting: Neurology

## 2016-01-24 VITALS — BP 134/71 | HR 83 | Ht 70.0 in | Wt 174.0 lb

## 2016-01-24 DIAGNOSIS — Z5181 Encounter for therapeutic drug level monitoring: Secondary | ICD-10-CM | POA: Diagnosis not present

## 2016-01-24 DIAGNOSIS — E538 Deficiency of other specified B group vitamins: Secondary | ICD-10-CM

## 2016-01-24 DIAGNOSIS — R269 Unspecified abnormalities of gait and mobility: Secondary | ICD-10-CM | POA: Diagnosis not present

## 2016-01-24 NOTE — Progress Notes (Signed)
Reason for visit: Gait disorder  Referring physician: Dr. Earmon Phoenix is a 75 y.o. male  History of present illness:  Timothy Wheeler is a 75 year old right-handed white male with a history of a significant change in functional level over the last 7 or 8 weeks. The patient found several ticks on him in late May or early June 2007. Around that time, he began to note some problems with feeling fatigued and drowsy and sleepy throughout the day. The patient began having muscle aches in the legs that gradually spread to include the low back and up into the interscapular area. The patient has had some numbness in the legs, spreading to the thighs in the lower abdomen with some alteration in bowel and bladder control with constipation and some difficulty voiding the bladder. The patient has been placed on antibiotics with doxycycline after he was found to have antibodies for [facility] spotted fever. The patient has had some slight improvement in the numbness of the legs since being on antibiotics. He has some fatigue of the legs, some gait instability associated with this. The patient has had an occasional fall. He has had a reduction in appetite, generalized malaise, and weight loss of up to 30 pounds. 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 The patient is sent to this office for an evaluation. There may have been some slight change in memory and concentration as well. The patient was seen in the emergency room at one point with a blood pressure in the 60 systolic range, the patient was felt to be dehydrated.  Past Medical History:  Diagnosis Date  . AAA (abdominal aortic aneurysm) (HCC)    per note of 05/15/2014 , 3.8 cm per note of 05/15/2014   . Atrial fibrillation (HCC)   . Coronary artery disease    nonobstructive per note- 05/15/2014    . Heart murmur   . Hyperlipidemia   . Hypertension   . Mitral  regurgitation    mild to moderate per note of 05/15/2014    . Sleep apnea    cpap  setting at 12     Past Surgical History:  Procedure Laterality Date  . left knee arthroscopy     . PENILE PROSTHESIS IMPLANT      Family History  Problem Relation Age of Onset  . Heart failure Mother     Social history:  reports that he quit smoking about 10 years ago. His smoking use included Cigarettes. He has a 55.00 pack-year smoking history. His smokeless tobacco use includes Chew. He reports that he does not drink alcohol or use drugs.  Medications:  Prior to Admission medications   Medication Sig Start Date End Date Taking? Authorizing Provider  amitriptyline (ELAVIL) 25 MG tablet Take 25 mg by mouth at bedtime.    Historical Provider, MD  benzonatate (TESSALON) 200 MG capsule Take 200 mg by mouth 3 (three) times daily as needed for cough.    Historical Provider, MD  budesonide-formoterol (SYMBICORT) 160-4.5 MCG/ACT inhaler Take 2 puffs first thing in am and then another 2 puffs about 12 hours later. 09/17/15   Nyoka Cowden, MD  CALCIUM-VITAMIN D PO Take 1 tablet by mouth every morning.    Historical Provider, MD  carisoprodol (SOMA) 350 MG tablet Take 350 mg by mouth 2 (two) times daily as needed for muscle spasms.    Historical Provider, MD  Coenzyme Q10 (  CO Q 10 PO) Take 1 tablet by mouth every morning.    Historical Provider, MD  COLCRYS 0.6 MG tablet Take 1 tablet by mouth every 4 (four) hours as needed. 08/22/15   Historical Provider, MD  Cyanocobalamin (VITAMIN B 12 PO) Take 1 tablet by mouth every morning.    Historical Provider, MD  dabigatran (PRADAXA) 150 MG CAPS capsule Take 150 mg by mouth 2 (two) times daily.    Historical Provider, MD  flecainide (TAMBOCOR) 100 MG tablet Take 1 tablet by mouth 2 (two) times daily. 10/02/15   Historical Provider, MD  FOLIC ACID PO Take 1 tablet by mouth every morning.    Historical Provider, MD  HYDROcodone-acetaminophen (NORCO) 10-325 MG tablet Take  1 tablet by mouth every 6 (six) hours as needed.    Historical Provider, MD  Magnesium 250 MG TABS Take 1 tablet by mouth every morning.    Historical Provider, MD  Multiple Vitamin (MULTIVITAMIN WITH MINERALS) TABS tablet Take 1 tablet by mouth every morning.    Historical Provider, MD  Pyridoxine HCl (B-6 PO) Take 1 tablet by mouth daily.    Historical Provider, MD  Pyridoxine HCl (B-6 PO) Take by mouth.    Historical Provider, MD  rOPINIRole (REQUIP) 0.5 MG tablet Take 0.5 mg by mouth at bedtime.    Historical Provider, MD  simvastatin (ZOCOR) 40 MG tablet Take 1 tablet by mouth daily. 10/22/15   Historical Provider, MD  topiramate (TOPAMAX) 25 MG tablet Take 25 mg by mouth 2 (two) times daily.    Historical Provider, MD  valsartan-hydrochlorothiazide (DIOVAN-HCT) 160-25 MG per tablet Take 1 tablet by mouth 2 (two) times daily.    Historical Provider, MD     No Known Allergies  ROS:  Out of a complete 14 system review of symptoms, the patient complains only of the following symptoms, and all other reviewed systems are negative.  Walking problems Numbness Sleepiness Fatigue  Blood pressure 134/71, pulse 83, height 5\' 10"  (1.778 m), weight 174 lb (78.9 kg).  Physical Exam  General: The patient is alert and cooperative at the time of the examination.  Eyes: Pupils are equal, round, and reactive to light. Discs are flat bilaterally.  Neck: The neck is supple, no carotid bruits are noted.  Respiratory: The respiratory examination is clear.  Cardiovascular: The cardiovascular examination reveals a regular rate and rhythm, no obvious murmurs or rubs are noted.  Skin: Extremities are without significant edema.  Neurologic Exam  Mental status: The patient is alert and oriented x 3 at the time of the examination. The patient has apparent normal recent and remote memory, with an apparently normal attention span and concentration ability. Mini-Mental Status Examination done today shows a  total score 26/30.  Cranial nerves: Facial symmetry is present. There is good sensation of the face to pinprick and soft touch bilaterally. The strength of the facial muscles and the muscles to head turning and shoulder shrug are normal bilaterally. Speech is well enunciated, no aphasia or dysarthria is noted. Extraocular movements are full. Visual fields are full. The tongue is midline, and the patient has symmetric elevation of the soft palate. No obvious hearing deficits are noted.  Motor: The motor testing reveals 5 over 5 strength of all 4 extremities. Good symmetric motor tone is noted throughout.  Sensory: Sensory testing is intact to pinprick, soft touch, vibration sensation, and position sense on all 4 extremities. No definite sensory level on the body is noted, no stocking pattern pinprick  sensory deficit is noted. No evidence of extinction is noted.  Coordination: Cerebellar testing reveals good finger-nose-finger and heel-to-shin bilaterally.  Gait and station: Gait is normal. Tandem gait is unsteady. Romberg is negative. No drift is seen.  Reflexes: Deep tendon reflexes are symmetric and normal bilaterally. Toes are downgoing bilaterally.   Assessment/Plan:  1. Gait disorder, leg weakness  2. [facility] spotted fever  The patient has had a significant change in his functional level that seems concurrent with the [facility] spotted fever infection. The patient is on antibiotic therapy. He has had significant weight loss, his appetite has declined significantly. The patient will be sent for further blood work, given the sensory level with numbness in the lower abdomen and legs, MRI of the thoracic spine and lumbar spine will be done. The patient will follow-up in 6 weeks. If the symptoms progress, we may consider a lumbar puncture.   Timothy Palau MD 01/24/2016 4:45 PM  [facility] [address] [place], [place] [address]  Phone [phone]  Fax [phone]

## 2016-01-25 ENCOUNTER — Other Ambulatory Visit: Payer: Self-pay | Admitting: Neurology

## 2016-01-27 LAB — CBC WITH DIFFERENTIAL/PLATELET
BASOS: 1 %
Basophils Absolute: 0.1 10*3/uL (ref 0.0–0.2)
EOS (ABSOLUTE): 0.2 10*3/uL (ref 0.0–0.4)
Eos: 3 %
Hematocrit: 39.9 % (ref 37.5–51.0)
Hemoglobin: 13.8 g/dL (ref 12.6–17.7)
IMMATURE GRANS (ABS): 0 10*3/uL (ref 0.0–0.1)
IMMATURE GRANULOCYTES: 0 %
LYMPHS: 40 %
Lymphocytes Absolute: 2.5 10*3/uL (ref 0.7–3.1)
MCH: 30.5 pg (ref 26.6–33.0)
MCHC: 34.6 g/dL (ref 31.5–35.7)
MCV: 88 fL (ref 79–97)
Monocytes Absolute: 0.6 10*3/uL (ref 0.1–0.9)
Monocytes: 10 %
NEUTROS PCT: 46 %
Neutrophils Absolute: 2.9 10*3/uL (ref 1.4–7.0)
PLATELETS: 192 10*3/uL (ref 150–379)
RBC: 4.52 x10E6/uL (ref 4.14–5.80)
RDW: 13.8 % (ref 12.3–15.4)
WBC: 6.3 10*3/uL (ref 3.4–10.8)

## 2016-01-27 LAB — COMPREHENSIVE METABOLIC PANEL
ALT: 13 IU/L (ref 0–44)
AST: 21 IU/L (ref 0–40)
Albumin/Globulin Ratio: 1.8 (ref 1.2–2.2)
Albumin: 4 g/dL (ref 3.5–4.8)
Alkaline Phosphatase: 63 IU/L (ref 39–117)
BUN/Creatinine Ratio: 16 (ref 10–24)
BUN: 22 mg/dL (ref 8–27)
Bilirubin Total: 0.6 mg/dL (ref 0.0–1.2)
CO2: 19 mmol/L (ref 18–29)
Calcium: 9.2 mg/dL (ref 8.6–10.2)
Chloride: 110 mmol/L — ABNORMAL HIGH (ref 96–106)
Creatinine, Ser: 1.41 mg/dL — ABNORMAL HIGH (ref 0.76–1.27)
GFR calc Af Amer: 56 mL/min/{1.73_m2} — ABNORMAL LOW (ref >59)
GFR calc non Af Amer: 49 mL/min/{1.73_m2} — ABNORMAL LOW (ref >59)
Globulin, Total: 2.2 g/dL (ref 1.5–4.5)
Glucose: 96 mg/dL (ref 65–99)
Potassium: 3.9 mmol/L (ref 3.5–5.2)
Sodium: 144 mmol/L (ref 134–144)
Total Protein: 6.2 g/dL (ref 6.0–8.5)

## 2016-01-27 LAB — ANGIOTENSIN CONVERTING ENZYME: ANGIO CONVERT ENZYME: 30 U/L (ref 14–82)

## 2016-01-27 LAB — COPPER, SERUM: Copper: 87 ug/dL (ref 72–166)

## 2016-01-27 LAB — SEDIMENTATION RATE: Sed Rate: 2 mm/hr (ref 0–30)

## 2016-01-27 LAB — ANA W/REFLEX: Anti Nuclear Antibody(ANA): NEGATIVE

## 2016-01-27 LAB — VITAMIN B12: Vitamin B-12: 444 pg/mL (ref 211–946)

## 2016-01-27 LAB — RPR: RPR Ser Ql: NONREACTIVE

## 2016-01-28 ENCOUNTER — Telehealth: Payer: Self-pay | Admitting: Neurology

## 2016-01-28 NOTE — Telephone Encounter (Signed)
Daughter Boris Sharperllen Bailey [phone] called regarding whether or not MRI was scheduled in [place]?

## 2016-01-28 NOTE — Telephone Encounter (Signed)
Daughter Boris Sharperllen Bailey called regarding lab results. Please call.

## 2016-01-28 NOTE — Telephone Encounter (Signed)
I called and talked with the family, the results of the blood work were released on my chart. They are normal, he has been set up for MRI of the lumbar spine and thoracic spine. I will call when I get these results.

## 2016-01-31 NOTE — Telephone Encounter (Signed)
Pt's daughter Boris Sharperllen Bailey called to discuss MRI scheduling , [place]? Please call [phone]. Thank you

## 2016-01-31 NOTE — Telephone Encounter (Signed)
Returned called and left a message with the patient. Also called and left a message with the daughter Alvino Chapelllen.

## 2016-01-31 NOTE — Telephone Encounter (Signed)
Pt's daughter Timothy Wheeler called said they would like to schedule the MRI [facility].

## 2016-02-04 NOTE — Telephone Encounter (Signed)
Spoke with Alvino Chapelllen who stated that they had already scheduled the imaging [facility].

## 2016-02-05 ENCOUNTER — Telehealth: Payer: Self-pay | Admitting: Neurology

## 2016-02-05 NOTE — Telephone Encounter (Signed)
Daughter Boris Sharperllen Bailey called to advise, she has observations and research she has done on patient's symptoms. She will send this as a message through patient's MyChart account.

## 2016-02-11 ENCOUNTER — Telehealth: Payer: Self-pay | Admitting: Neurology

## 2016-02-11 ENCOUNTER — Ambulatory Visit
Admission: RE | Admit: 2016-02-11 | Discharge: 2016-02-11 | Disposition: A | Discharge status: Home / Self Care | Payer: Medicare Other | Source: Ambulatory Visit | Attending: Neurology | Admitting: Neurology

## 2016-02-11 DIAGNOSIS — R269 Unspecified abnormalities of gait and mobility: Secondary | ICD-10-CM

## 2016-02-11 NOTE — Telephone Encounter (Signed)
I called patient. MRI of the thoracic spine did not show any lesions within the spinal cord, no spinal cord compression. If the symptoms are worsening, we may consider a lumbar puncture.  MRI thoracic 02/11/16:  IMPRESSION: 1. No acute osseous abnormality in the thoracic spine. Stable mild dextro convex scoliosis. 2. Widespread mild thoracic disc degeneration and up to moderate lower thoracic facet degeneration. 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 No explanation for T10 level numbness.

## 2016-02-13 ENCOUNTER — Encounter: Payer: Self-pay | Admitting: Neurology

## 2016-02-13 ENCOUNTER — Telehealth: Payer: Self-pay | Admitting: Neurology

## 2016-02-13 NOTE — Telephone Encounter (Signed)
I called patient. The MRI of the low back does not show any findings that would impair his ability to ambulate. MRI of the thoracic spine did not show any spinal cord compression. The patient is not improving with his walking with antibiotic therapy, I would consider lumbar puncture and spinal fluid analysis. The patient will call me if this is the case.   MRI lumbar 02/13/16:  IMPRESSION:  Abnormal MRI lumbar spine (without) demonstrating: 1. At L4-5: pseudodisc bulging and facet arthropathy and ligamentum flavum hypertrophy with mild-moderate spinal stenosis and mild right foraminal stenosis. 2. At L5-S1: uncovertebral joint hypertrophy and facet hypertrophy with moderate biforaminal stenosis. 3. Compared to MRI from 05/02/14, there has been mild progression of degenerative spine disease at L4-5 and L5-S1.

## 2016-02-22 ENCOUNTER — Ambulatory Visit (INDEPENDENT_AMBULATORY_CARE_PROVIDER_SITE_OTHER): Payer: Medicare Other | Admitting: Neurology

## 2016-02-22 ENCOUNTER — Encounter: Payer: Self-pay | Admitting: Neurology

## 2016-02-22 VITALS — BP 157/73 | HR 73 | Ht 70.0 in | Wt 173.0 lb

## 2016-02-22 DIAGNOSIS — R413 Other amnesia: Secondary | ICD-10-CM

## 2016-02-22 DIAGNOSIS — R269 Unspecified abnormalities of gait and mobility: Secondary | ICD-10-CM

## 2016-02-22 HISTORY — DX: Other amnesia: R41.3

## 2016-02-22 MED ORDER — TOPIRAMATE 100 MG PO TABS: 100.0000 mg | ORAL_TABLET | Freq: Two times a day (BID) | ORAL | Status: DC

## 2016-02-22 NOTE — Progress Notes (Signed)
Reason for visit: Memory disorder, gait disorder  Timothy Wheeler is an 75 y.o. male  History of present illness:  Timothy Wheeler is a 75 year old right-handed white male with a history of a memory disturbance dating back 2 years. Timothy Wheeler has had issues with significant weight loss, but Timothy Wheeler has been on Topamax taking 100 mg twice daily, apparently prescribed by Dr. Isabell Jarvis for tremor. Timothy Wheeler has been on this for about 18 months, but Timothy Wheeler has had significant weight loss recently over 30 pounds. Timothy Wheeler has developed worsening cognitive functioning. He is also on amitriptyline 25 mg at night, he has been on this for quite a number of years. Timothy Wheeler has restless leg syndrome and he is taking Requip 0.5 mg at night. Timothy Wheeler takes hydrocodone for back pain. Timothy Wheeler returns today indicating that Timothy numbness in Timothy legs has improved, he has a burning sensation across Timothy lower abdomen on Timothy left. Timothy Wheeler has been on CPAP for sleep apnea, but he has not been using his machine over Timothy last 3 months. Timothy Wheeler overall feels better than he did on Timothy last visit. Timothy Wheeler has undergone MRI evaluation of Timothy thoracic spine and lumbar spine did not show evidence of significant spinal stenosis. He returns for an evaluation.  Past Medical History:  Diagnosis Date  . AAA (abdominal aortic aneurysm) (HCC)    per note of 05/15/2014 , 3.8 cm per note of 05/15/2014   . Atrial fibrillation (HCC)   . Coronary artery disease    nonobstructive per note- 05/15/2014    . Heart murmur   . History of shingles   . Hyperlipidemia   . Hypertension   . Mitral regurgitation    mild to moderate per note of 05/15/2014    . [facility] spotted fever)   . Sleep apnea    cpap  setting at 12     Past Surgical History:  Procedure Laterality Date  . left knee arthroscopy     . PENILE PROSTHESIS IMPLANT      Family History  Problem Relation Age of Onset  . Heart  failure Mother   . Heart disease Brother     Social history:  reports that he quit smoking about 10 years ago. His smoking use included Cigarettes. He has a 55.00 pack-year smoking history. His smokeless tobacco use includes Chew. He reports that he does not drink alcohol or use drugs.   No Known Allergies  Medications:  Prior to Admission medications   Medication Sig Start Date End Date Taking? Authorizing Provider  amitriptyline (ELAVIL) 25 MG tablet Take 25 mg by mouth at bedtime.   Yes Historical Provider, MD  benzonatate (TESSALON) 200 MG capsule Take 200 mg by mouth 3 (three) times daily as needed for cough.   Yes Historical Provider, MD  budesonide-formoterol (SYMBICORT) 160-4.5 MCG/ACT inhaler Take 2 puffs first thing in am and then another 2 puffs about 12 hours later. 09/17/15  Yes Nyoka Cowden, MD  CALCIUM-VITAMIN D PO Take 1 tablet by mouth every morning.   Yes Historical Provider, MD  carisoprodol (SOMA) 350 MG tablet Take 350 mg by mouth 2 (two) times daily as needed for muscle spasms.   Yes Historical Provider, MD  Coenzyme Q10 (CO Q 10 PO) Take 1 tablet by mouth every morning.   Yes Historical Provider, MD  COLCRYS 0.6 MG tablet Take 1 tablet by mouth every 4 (four) hours as  needed. 08/22/15  Yes Historical Provider, MD  Cyanocobalamin (VITAMIN B 12 PO) Take 1 tablet by mouth every morning.   Yes Historical Provider, MD  dabigatran (PRADAXA) 150 MG CAPS capsule Take 150 mg by mouth 2 (two) times daily.   Yes Historical Provider, MD  flecainide (TAMBOCOR) 100 MG tablet Take 1 tablet by mouth 2 (two) times daily. 10/02/15  Yes Historical Provider, MD  FOLIC ACID PO Take 1 tablet by mouth every morning.   Yes Historical Provider, MD  HYDROcodone-acetaminophen (NORCO) 10-325 MG tablet Take 1 tablet by mouth every 6 (six) hours as needed.   Yes Historical Provider, MD  Magnesium 250 MG TABS Take 1 tablet by mouth every morning.   Yes Historical Provider, MD  Multiple Vitamin  (MULTIVITAMIN WITH MINERALS) TABS tablet Take 1 tablet by mouth every morning.   Yes Historical Provider, MD  Pyridoxine HCl (B-6 PO) Take 1 tablet by mouth daily.   Yes Historical Provider, MD  Pyridoxine HCl (B-6 PO) Take by mouth.   Yes Historical Provider, MD  rOPINIRole (REQUIP) 0.5 MG tablet Take 0.5 mg by mouth at bedtime.   Yes Historical Provider, MD  simvastatin (ZOCOR) 40 MG tablet Take 1 tablet by mouth daily. 10/22/15  Yes Historical Provider, MD  topiramate (TOPAMAX) 25 MG tablet Take 25 mg by mouth 2 (two) times daily.   Yes Historical Provider, MD  valsartan-hydrochlorothiazide (DIOVAN-HCT) 160-25 MG per tablet Take 1 tablet by mouth 2 (two) times daily.   Yes Historical Provider, MD    ROS:  Out of a complete 14 system review of symptoms, Timothy Wheeler complains only of Timothy following symptoms, and all other reviewed systems are negative.  Memory disturbance Gait disturbance  Height 5\' 10"  (1.778 m), weight 173 lb (78.5 kg).  Physical Exam  General: Timothy Wheeler is alert and cooperative at Timothy time of Timothy examination.  Skin: No significant peripheral edema is noted.   Neurologic Exam  Mental status: Timothy Wheeler is alert and oriented x 3 at Timothy time of Timothy examination. Timothy Wheeler has apparent normal recent and remote memory, with an apparently normal attention span and concentration ability. Mini-Mental Status Examination done today shows a total score 26/30.  Cranial nerves: Facial symmetry is present. Speech is normal, no aphasia or dysarthria is noted. Extraocular movements are full. Visual fields are full.  Motor: Timothy Wheeler has good strength in all 4 extremities.  Sensory examination: Soft touch sensation is symmetric on Timothy face, arms, and legs.  Coordination: Timothy Wheeler has good finger-nose-finger and heel-to-shin bilaterally.  Gait and station: Timothy Wheeler has good stride with walking, he is able to ambulate independently. Tandem gait is unsteady. Romberg is  negative.  Reflexes: Deep tendon reflexes are symmetric.   Assessment/Plan:  1. Progressive memory disorder   2. Gait disorder   3. Chronic low back pain   Timothy Wheeler appears by history to have a pre-existing chronic progressive memory disorder over Timothy last 2 years. He is on multiple psychoactive medications that may impair memory. Timothy Wheeler will be tapered off of Topamax, this is Timothy likely source of his significant weight loss. Once he is off of Timothy Topamax, Timothy family is to contact our office, and we will try to taper him off of Timothy amitriptyline and eventually Timothy Requip. Timothy Wheeler is acting out dreams at night, this may be related to Timothy Requip. Timothy Wheeler will follow-up in 4 months. We will follow Timothy memory issues.   Marlan Palau  MD 02/22/2016 9:45 AM  [facility] [address] [place], [place] [id number]  Phone [phone] Fax [phone]

## 2016-02-22 NOTE — Patient Instructions (Signed)
We will reduce the Topamax 100 mg tablets, Begin 1/2 tablet in the morning and one at night for 2 weeks, then go to 1/2 tablet twice a day for 2 weeks, then take 1/2 tablet at night for 2 weeks, then stop the medication.  Call me at that time, we will try to reduce the amitriptyline and requip.

## 2016-02-27 ENCOUNTER — Ambulatory Visit: Payer: Self-pay | Admitting: Neurology

## 2016-07-24 ENCOUNTER — Ambulatory Visit (INDEPENDENT_AMBULATORY_CARE_PROVIDER_SITE_OTHER): Payer: Medicare Other | Admitting: Neurology

## 2016-07-24 ENCOUNTER — Encounter: Payer: Self-pay | Admitting: Neurology

## 2016-07-24 VITALS — BP 94/58 | HR 89 | Ht 70.0 in | Wt 163.8 lb

## 2016-07-24 DIAGNOSIS — R413 Other amnesia: Secondary | ICD-10-CM | POA: Diagnosis not present

## 2016-07-24 DIAGNOSIS — R269 Unspecified abnormalities of gait and mobility: Secondary | ICD-10-CM | POA: Diagnosis not present

## 2016-07-24 MED ORDER — ALPRAZOLAM 0.25 MG PO TABS: 0.2500 mg | ORAL_TABLET | Freq: Three times a day (TID) | ORAL | 2 refills | Status: DC | PRN

## 2016-07-24 NOTE — Patient Instructions (Signed)
   We will try alprazolam if needed for the tremor.

## 2016-07-24 NOTE — Progress Notes (Signed)
Reason for visit: Memory disturbance  Timothy Wheeler is an 76 y.o. male  History of present illness:  Timothy Wheeler is a 76 year old right-handed white male with a history of a progressive memory disturbance. The patient had been feeling quite poorly, he had been on several medications to include Topamax, amitriptyline, and Requip that may have been resulting in some cognitive impairment. The patient has come off of all of these medications, he feels much better. Unfortunately, his tremor has worsened off of the Topamax. The patient had a bout with [facility] spotted fever in the fall of 2017, he was quite sick during that period of time. He is now recovering from this. He is getting physical therapy which has helped his ability and his low back pain. He lives by himself, he tries to stay as active as possible. He recently had a blackout episode associated with orthostatic hypotension. The patient is on 2 antihypertensive medications and a diuretic. The patient comes to the office today for an evaluation. He reports that he is having increasing difficulty with handwriting because of the tremor. Constipation has been a big problem for him, he is on MiraLAX and Dulcolax suppositories and Colace.  Past Medical History:  Diagnosis Date  . AAA (abdominal aortic aneurysm) (HCC)    per note of 05/15/2014 , 3.8 cm per note of 05/15/2014   . Atrial fibrillation (HCC)   . Constipation   . Coronary artery disease    nonobstructive per note- 05/15/2014    . Heart murmur   . History of shingles   . Hyperlipidemia   . Hypertension   . Memory disorder 02/22/2016  . Mitral regurgitation    mild to moderate per note of 05/15/2014    . RMS[facility] spotted fever)   . Sleep apnea    cpap  setting at 12     Past Surgical History:  Procedure Laterality Date  . left knee arthroscopy     . PENILE PROSTHESIS IMPLANT      Family History  Problem Relation Age of Onset  . Heart failure Mother     . Heart disease Brother     Social history:  reports that he quit smoking about 11 years ago. His smoking use included Cigarettes. He has a 55.00 pack-year smoking history. His smokeless tobacco use includes Chew. He reports that he does not drink alcohol or use drugs.   No Known Allergies  Medications:  Prior to Admission medications   Medication Sig Start Date End Date Taking? Authorizing Provider  amLODipine (NORVASC) 5 MG tablet Take 5 mg by mouth daily.  07/14/16  Yes Historical Provider, MD  CALCIUM-VITAMIN D PO Take 1 tablet by mouth every morning.   Yes Historical Provider, MD  Coenzyme Q10 (CO Q 10 PO) Take 1 tablet by mouth every morning.   Yes Historical Provider, MD  COLCRYS 0.6 MG tablet Take 1 tablet by mouth every 4 (four) hours as needed. 08/22/15  Yes Historical Provider, MD  Cyanocobalamin (VITAMIN B 12 PO) Take 1 tablet by mouth every morning.   Yes Historical Provider, MD  dabigatran (PRADAXA) 150 MG CAPS capsule Take 150 mg by mouth 2 (two) times daily.   Yes Historical Provider, MD  flecainide (TAMBOCOR) 100 MG tablet Take 1 tablet by mouth 2 (two) times daily. 10/02/15  Yes Historical Provider, MD  Magnesium 250 MG TABS Take 1 tablet by mouth every morning.   Yes Historical Provider, MD  Multiple Vitamin (MULTIVITAMIN  WITH MINERALS) TABS tablet Take 1 tablet by mouth every morning.   Yes Historical Provider, MD  Pyridoxine HCl (B-6 PO) Take 1 tablet by mouth daily.   Yes Historical Provider, MD  simvastatin (ZOCOR) 40 MG tablet Take 1 tablet by mouth daily. 10/22/15  Yes Historical Provider, MD  valsartan-hydrochlorothiazide (DIOVAN-HCT) 80-12.5 MG tablet Take 1 tablet by mouth 2 (two) times daily.   Yes Historical Provider, MD    ROS:  Out of a complete 14 system review of symptoms, the patient complains only of the following symptoms, and all other reviewed systems are negative.  Decreased activity, weight loss Runny nose, drooling Shortness of breath Cold  intolerance, heat intolerance Constipation Sleep apnea Back pain Memory loss, tremors Decreased concentration, depression  Blood pressure (!) 94/58, pulse 89, height 5\' 10"  (1.778 m), weight 163 lb 12 oz (74.3 kg).  Physical Exam  General: The patient is alert and cooperative at the time of the examination.  Skin: No significant peripheral edema is noted.   Neurologic Exam  Mental status: The patient is alert and oriented x 3 at the time of the examination. The patient has apparent normal recent and remote memory, with an apparently normal attention span and concentration ability. Mini-Mental Status Examination done today shows a total score 27/30.   Cranial nerves: Facial symmetry is present. Speech is normal, no aphasia or dysarthria is noted. Extraocular movements are full. Visual fields are full.  Motor: The patient has good strength in all 4 extremities.  Sensory examination: Soft touch sensation is symmetric on the face, arms, and legs.  Coordination: The patient has good finger-nose-finger and heel-to-shin bilaterally. Intention tremors are seen with the arms bilaterally.  Gait and station: The patient has a normal gait. Tandem gait is unsteady. Romberg is negative. No drift is seen.  Reflexes: Deep tendon reflexes are symmetric.   Assessment/Plan:  1. Mild memory disturbance  2. Essential tremors  3. Chronic low back pain  4. Mild gait disturbance  5. Constipation  The patient is to start Senokot for the constipation as well. The patient will be placed on low-dose alprazolam to take if needed for the tremor, not to take daily. We will continue to follow the memory issues over time. The patient will follow-up in about 6 months.  Timothy Palau. Keith Jaimin Krupka MD 07/24/2016 4:40 PM  [facility] [address] [place], [place] [id number]  Phone [phone] Fax [phone]

## 2016-12-21 IMAGING — DX DG CHEST 2V
2 series · 2 of 2 positions shown · non-contrast
Comparison: 09/17/2015

CLINICAL DATA: Routine follow-up.  Community acquired pneumonia.

EXAM:
CHEST  2 VIEW

[chest pa]
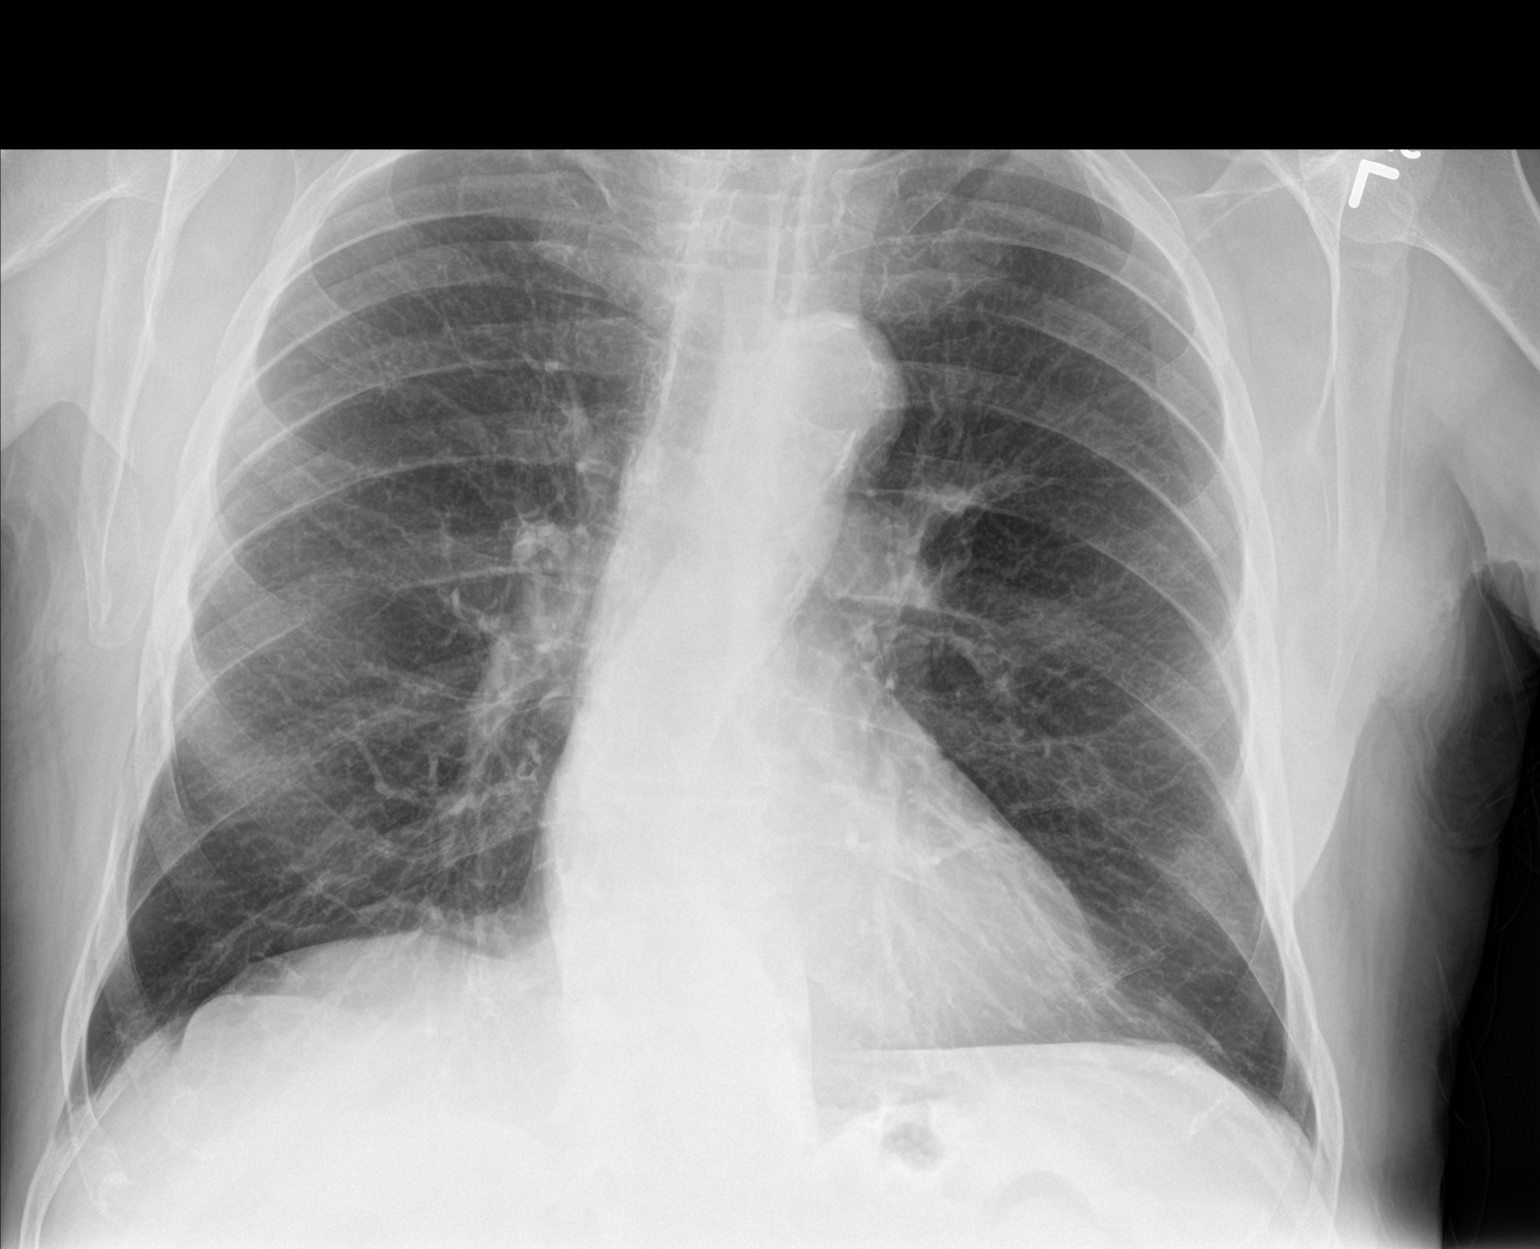

[chest lat]
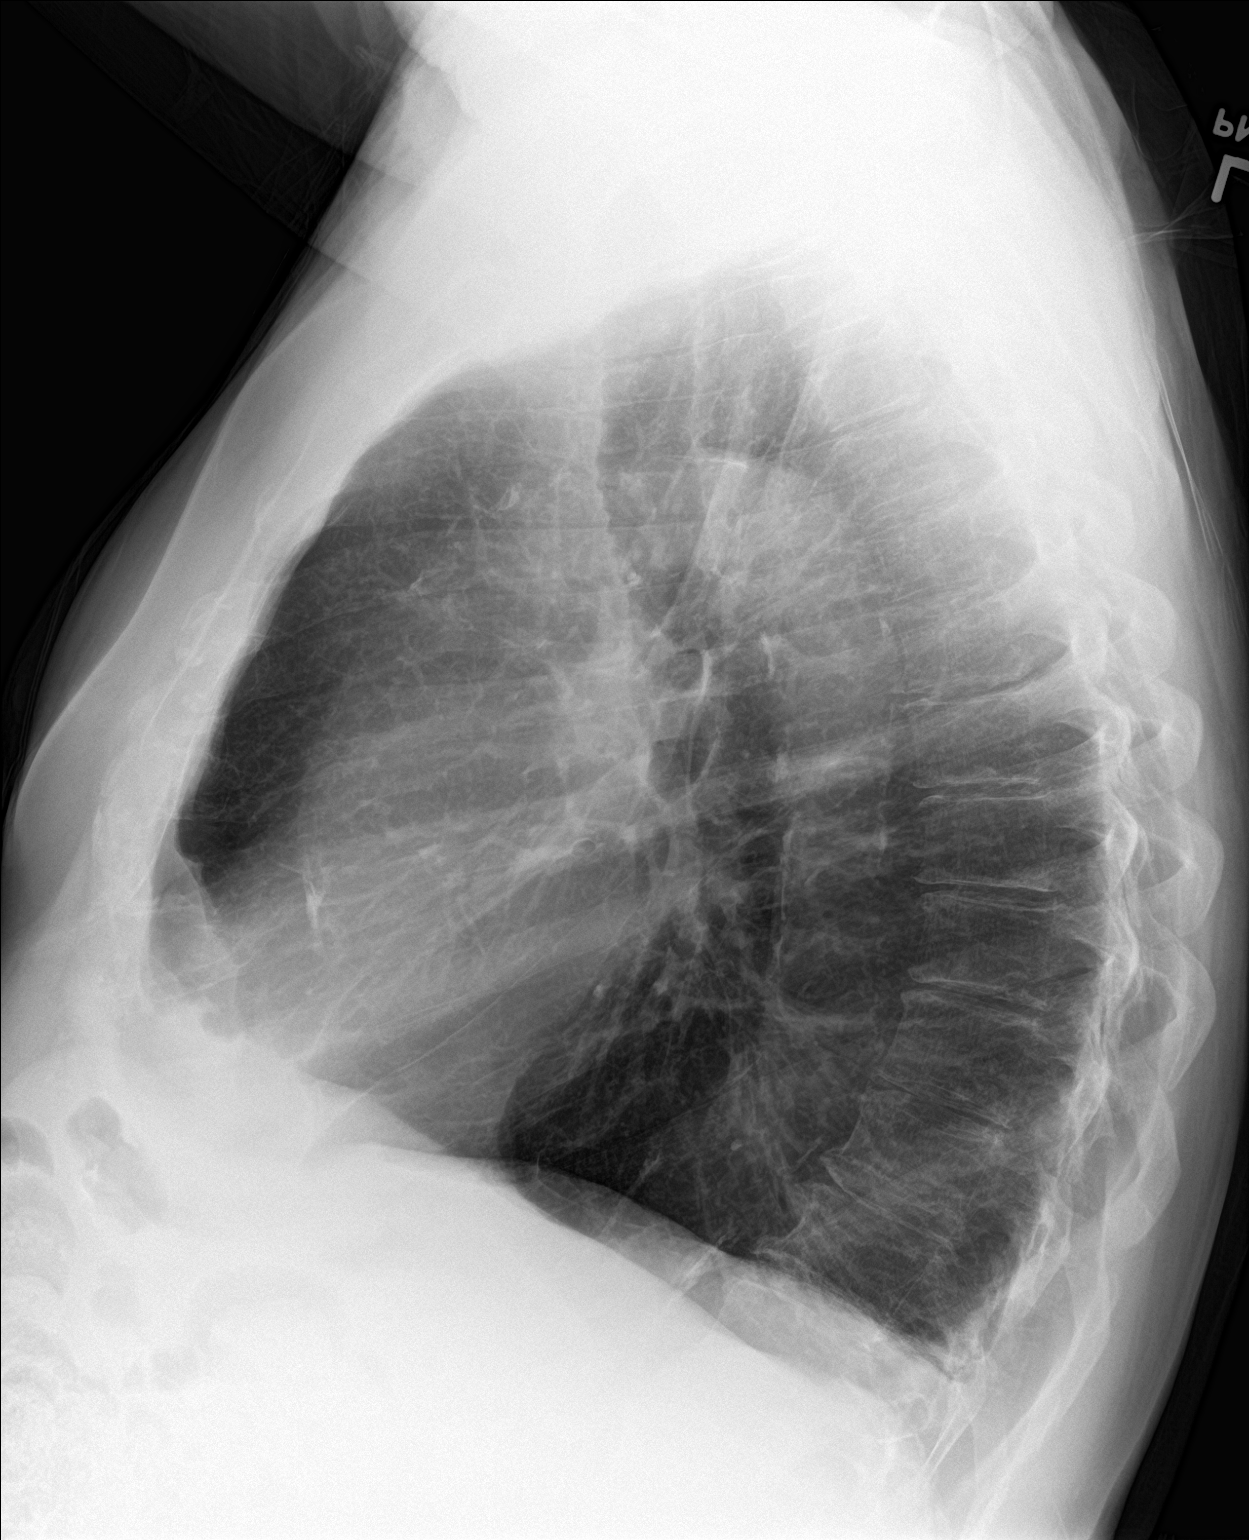

[2 of 2 positions shown; findings below may reference images not displayed]

FINDINGS: There is hyperinflation of the lungs compatible with COPD. Linear
densities at the lung bases, likely scarring, similar prior study.
Heart is normal size. Tortuosity and calcifications within the
thoracic aorta. No effusions or acute bony abnormality.
IMPRESSION: COPD/chronic changes.  No active disease.

## 2017-01-26 ENCOUNTER — Ambulatory Visit (INDEPENDENT_AMBULATORY_CARE_PROVIDER_SITE_OTHER): Payer: Medicare Other | Admitting: Neurology

## 2017-01-26 ENCOUNTER — Encounter: Payer: Self-pay | Admitting: Neurology

## 2017-01-26 DIAGNOSIS — A881 Epidemic vertigo: Secondary | ICD-10-CM | POA: Diagnosis not present

## 2017-01-26 NOTE — Patient Instructions (Signed)
    We will check MRI of the brain. 

## 2017-01-26 NOTE — Progress Notes (Signed)
Reason for visit: Memory disorder  Timothy Wheeler is an 76 y.o. male  History of present illness:  Timothy Wheeler is a 76 year old right-handed white male with a history of problems with memory that was exacerbated by the use of Topamax. The patient has an essential tremor, this has worsened slightly coming off the Topamax but he feels better cognitively. He recently had another outbreak of shingles on the right T8 level. The patient does have some gait instability, he has not had any falls. 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 The patient has had 2 events of an imbalance feeling, the first event occurred 3 months ago and he felt as if he is being pulled to the side. He did not black out. He had a second event 1 month ago, he felt dizzy but did not feel like he was being pulled over to one side. He does have a history of vertigo but he claims that this did not feel like vertigo. He claims the episodes lasted less than 30 seconds. He had no other symptoms of headache, numbness, or weakness. He was not confused during the events. He felt no palpitations of the heart. He does have some problems with shortness of breath, he was evaluated by his cardiologist. He does have a chronic cough. He returns to the office today for an evaluation. He claims he has had a recent carotid Doppler study. He was told this was normal.  Past Medical History:  Diagnosis Date  . AAA (abdominal aortic aneurysm) (HCC)    per note of 05/15/2014 , 3.8 cm per note of 05/15/2014   . Atrial fibrillation (HCC)   . Constipation   . Coronary artery disease    nonobstructive per note- 05/15/2014    . Heart murmur   . History of shingles   . Hyperlipidemia   . Hypertension   . Memory disorder 02/22/2016  . Mitral regurgitation    mild to moderate per note of 05/15/2014    . 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 Sleep apnea      cpap  setting at 12     Past Surgical History:  Procedure Laterality Date  . left knee arthroscopy     . PENILE PROSTHESIS IMPLANT      Family History  Problem Relation Age of Onset  . Heart failure Mother   . Heart disease Brother     Social history:  reports that he quit smoking about 11 years ago. His smoking use included Cigarettes. He has a 55.00 pack-year smoking history. His smokeless tobacco use includes Chew. He reports that he does not drink alcohol or use drugs.   No Known Allergies  Medications:  Prior to Admission medications   Medication Sig Start Date End Date Taking? Authorizing Provider  acyclovir (ZOVIRAX) 800 MG tablet Take 800 mg by mouth 5 (five) times daily.   Yes [provider]  ALPRAZolam (XANAX) 0.25 MG tablet Take 1 tablet (0.25 mg total) by mouth 3 (three) times daily as needed for anxiety. 07/24/16  Yes York SpanielWillis, Myrla Malanowski K, MD  amLODipine (NORVASC) 5 MG tablet Take 5 mg by mouth daily.  07/14/16  Yes [provider]  CALCIUM-VITAMIN D PO Take 1 tablet by mouth every morning.   Yes [provider]  Coenzyme Q10 (CO Q 10 PO) Take 1 tablet by mouth every morning.  Yes [provider]  Cyanocobalamin (VITAMIN B 12 PO) Take 1 tablet by mouth every morning.   Yes [provider]  dabigatran (PRADAXA) 150 MG CAPS capsule Take 150 mg by mouth 2 (two) times daily.   Yes [provider]  flecainide (TAMBOCOR) 100 MG tablet Take 1 tablet by mouth 2 (two) times daily. 10/02/15  Yes [provider]  gabapentin (NEURONTIN) 300 MG capsule Take 300 mg by mouth 2 (two) times daily.   Yes [provider]  GARLIC PO Take 161 mg by mouth daily.   Yes [provider]  levocetirizine (XYZAL) 5 MG tablet Take 5 mg by mouth every evening.   Yes [provider]  losartan (COZAAR) 50 MG tablet Take 50 mg by mouth daily.   Yes [provider]  Magnesium 250 MG TABS Take 1 tablet by  mouth every morning.   Yes [provider]  Melatonin 10 MG TABS Take 1 tablet by mouth daily. Plus L-theanine   Yes [provider]  Multiple Vitamin (MULTIVITAMIN WITH MINERALS) TABS tablet Take 1 tablet by mouth every morning.   Yes [provider]  polyethylene glycol (MIRALAX / GLYCOLAX) packet Take 17 g by mouth daily.   Yes [provider]  pramipexole (MIRAPEX) 0.25 MG tablet Take 0.25 mg by mouth daily.   Yes [provider]  predniSONE (DELTASONE) 10 MG tablet Take 10 mg by mouth daily with breakfast.   Yes [provider]  Pyridoxine HCl (B-6 PO) Take 1 tablet by mouth daily.   Yes [provider]  simvastatin (ZOCOR) 40 MG tablet Take 1 tablet by mouth daily. 10/22/15  Yes [provider]    ROS:  Out of a complete 14 system review of symptoms, the patient complains only of the following symptoms, and all other reviewed systems are negative.  Improved appetite Hearing loss, runny nose, drooling Cough, shortness of breath, chest tightness Heart murmur Cold intolerance, heat intolerance Insomnia, sleep apnea, snoring Environmental allergies Confusion Tremors, facial drooping  Blood pressure 129/61, pulse 74, height 5\' 10"  (1.778 m), weight 192 lb (87.1 kg).  Physical Exam  General: The patient is alert and cooperative at the time of the examination. The patient is moderately obese.  Neck: No carotid bruits were noted.  Respiratory: The patient has rhonchi noted on the left anterior lung field, no wheezing.  Cardiovascular: Regular rhythm, no murmurs noted.  Skin: No significant peripheral edema is noted.   Neurologic Exam  Mental status: The patient is alert and oriented x 3 at the time of the examination. The patient has apparent normal recent and remote memory, with an apparently normal attention span and concentration ability. Mini-Mental Status Examination done today shows a total score  28/30.   Cranial nerves: Facial symmetry is present. Speech is normal, no aphasia or dysarthria is noted. Extraocular movements are full. Visual fields are full.  Motor: The patient has good strength in all 4 extremities.  Sensory examination: Soft touch sensation is symmetric on the face, arms, and legs.  Coordination: The patient has good finger-nose-finger and heel-to-shin bilaterally.  Gait and station: The patient has a slightly wide-based gait. Tandem gait is slightly unsteady. Romberg is negative. No drift is seen.  Reflexes: Deep tendon reflexes are symmetric, but are depressed.   Assessment/Plan:  1. Mild memory disturbance  2. Episodic dizziness  3. Bilateral heel pain  The patient likely has heel pain as a mechanical source pain, I have recommended a  referral to a podiatrist. The patient has had episodes of dizziness, with a feeling of being pulled off to one side. I will check MRI of the brain, he claims that he has had a recent carotid Doppler study. The patient is on anticoagulant therapy. We will follow the memory issues over time, he will follow-up in 6 months.  Marlan Palau MD 01/26/2017 4:23 PM  [facility] [address] [place], [place] [address]  Phone [phone] Fax [phone]

## 2017-01-27 ENCOUNTER — Encounter: Payer: Self-pay | Admitting: Neurology

## 2017-01-30 ENCOUNTER — Telehealth: Payer: Self-pay | Admitting: Neurology

## 2017-01-30 NOTE — Telephone Encounter (Signed)
I called patient. The MRI the brain is stable from 2017, nothing to explain the problems with gait instability.   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

## 2017-08-10 ENCOUNTER — Ambulatory Visit: Payer: Medicare Other | Admitting: Neurology

## 2017-11-18 DIAGNOSIS — R55 Syncope and collapse: Secondary | ICD-10-CM | POA: Diagnosis not present

## 2017-11-21 DEATH — deceased
